# Patient Record
Sex: Female | Born: 1985
Health system: Southern US, Community
[De-identification: ages and names within clinical notes are randomized; demographics above are authoritative.]

## PROBLEM LIST (undated history)

## (undated) ENCOUNTER — Inpatient Hospital Stay (HOSPITAL_COMMUNITY): Payer: Self-pay

## (undated) DIAGNOSIS — I350 Nonrheumatic aortic (valve) stenosis: Secondary | ICD-10-CM

## (undated) DIAGNOSIS — F419 Anxiety disorder, unspecified: Secondary | ICD-10-CM

## (undated) DIAGNOSIS — F32A Depression, unspecified: Secondary | ICD-10-CM

## (undated) DIAGNOSIS — I719 Aortic aneurysm of unspecified site, without rupture: Secondary | ICD-10-CM

## (undated) DIAGNOSIS — D369 Benign neoplasm, unspecified site: Secondary | ICD-10-CM

## (undated) DIAGNOSIS — F329 Major depressive disorder, single episode, unspecified: Secondary | ICD-10-CM

## (undated) HISTORY — DX: Major depressive disorder, single episode, unspecified: F32.9

## (undated) HISTORY — PX: WISDOM TOOTH EXTRACTION: SHX21

## (undated) HISTORY — DX: Depression, unspecified: F32.A

## (undated) HISTORY — DX: Aortic aneurysm of unspecified site, without rupture: I71.9

---

## 2006-04-02 ENCOUNTER — Emergency Department (HOSPITAL_COMMUNITY): Admission: EM | Admit: 2006-04-02 | Discharge: 2006-04-02 | Payer: Self-pay | Admitting: Emergency Medicine

## 2010-03-01 ENCOUNTER — Other Ambulatory Visit: Admission: RE | Admit: 2010-03-01 | Discharge: 2010-03-01 | Payer: Self-pay | Admitting: Family Medicine

## 2010-06-30 ENCOUNTER — Emergency Department (HOSPITAL_BASED_OUTPATIENT_CLINIC_OR_DEPARTMENT_OTHER)
Admission: EM | Admit: 2010-06-30 | Discharge: 2010-07-01 | Payer: Self-pay | Source: Home / Self Care | Admitting: Emergency Medicine

## 2010-08-21 ENCOUNTER — Other Ambulatory Visit: Payer: Self-pay | Admitting: Cardiology

## 2010-08-28 ENCOUNTER — Other Ambulatory Visit (HOSPITAL_COMMUNITY): Payer: Self-pay

## 2010-09-04 ENCOUNTER — Other Ambulatory Visit: Payer: Self-pay | Admitting: Cardiology

## 2010-09-04 ENCOUNTER — Ambulatory Visit (HOSPITAL_COMMUNITY)
Admission: RE | Admit: 2010-09-04 | Discharge: 2010-09-04 | Disposition: A | Discharge status: Home / Self Care | Payer: 59 | Source: Ambulatory Visit | Attending: Cardiology | Admitting: Cardiology

## 2010-09-04 DIAGNOSIS — Q251 Coarctation of aorta: Secondary | ICD-10-CM

## 2010-09-04 DIAGNOSIS — Q249 Congenital malformation of heart, unspecified: Secondary | ICD-10-CM | POA: Insufficient documentation

## 2010-09-04 MED ORDER — GADOBENATE DIMEGLUMINE 529 MG/ML IV SOLN: 11.0000 mL | Freq: Once | INTRAVENOUS | Status: DC

## 2011-01-14 ENCOUNTER — Other Ambulatory Visit: Payer: Self-pay | Admitting: Family Medicine

## 2011-01-14 DIAGNOSIS — R1011 Right upper quadrant pain: Secondary | ICD-10-CM

## 2011-01-16 ENCOUNTER — Ambulatory Visit
Admission: RE | Admit: 2011-01-16 | Discharge: 2011-01-16 | Disposition: A | Discharge status: Home / Self Care | Payer: 59 | Source: Ambulatory Visit | Attending: Family Medicine | Admitting: Family Medicine

## 2011-01-16 DIAGNOSIS — R1011 Right upper quadrant pain: Secondary | ICD-10-CM

## 2011-08-14 ENCOUNTER — Other Ambulatory Visit (HOSPITAL_COMMUNITY): Payer: Self-pay | Admitting: Obstetrics and Gynecology

## 2011-08-14 DIAGNOSIS — N979 Female infertility, unspecified: Secondary | ICD-10-CM

## 2011-08-22 ENCOUNTER — Ambulatory Visit (HOSPITAL_COMMUNITY)
Admission: RE | Admit: 2011-08-22 | Discharge: 2011-08-22 | Disposition: A | Discharge status: Home / Self Care | Payer: 59 | Source: Ambulatory Visit | Attending: Obstetrics and Gynecology | Admitting: Obstetrics and Gynecology

## 2011-08-22 DIAGNOSIS — N979 Female infertility, unspecified: Secondary | ICD-10-CM | POA: Insufficient documentation

## 2011-08-22 MED ORDER — IOHEXOL 300 MG/ML  SOLN
20.0000 mL | Freq: Once | INTRAMUSCULAR | Status: AC | PRN
  Administered 2011-08-22: 10 mL

## 2011-08-30 ENCOUNTER — Encounter (HOSPITAL_BASED_OUTPATIENT_CLINIC_OR_DEPARTMENT_OTHER): Payer: Self-pay | Admitting: *Deleted

## 2011-08-30 ENCOUNTER — Emergency Department (HOSPITAL_BASED_OUTPATIENT_CLINIC_OR_DEPARTMENT_OTHER)
Admission: EM | Admit: 2011-08-30 | Discharge: 2011-08-31 | Disposition: A | Discharge status: Home / Self Care | Payer: 59 | Attending: Emergency Medicine | Admitting: Emergency Medicine

## 2011-08-30 ENCOUNTER — Emergency Department (HOSPITAL_BASED_OUTPATIENT_CLINIC_OR_DEPARTMENT_OTHER): Payer: 59

## 2011-08-30 DIAGNOSIS — J45909 Unspecified asthma, uncomplicated: Secondary | ICD-10-CM | POA: Insufficient documentation

## 2011-08-30 DIAGNOSIS — R059 Cough, unspecified: Secondary | ICD-10-CM | POA: Insufficient documentation

## 2011-08-30 DIAGNOSIS — R0602 Shortness of breath: Secondary | ICD-10-CM | POA: Insufficient documentation

## 2011-08-30 DIAGNOSIS — R062 Wheezing: Secondary | ICD-10-CM

## 2011-08-30 DIAGNOSIS — R05 Cough: Secondary | ICD-10-CM | POA: Insufficient documentation

## 2011-08-30 HISTORY — DX: Anxiety disorder, unspecified: F41.9

## 2011-08-30 HISTORY — DX: Nonrheumatic aortic (valve) stenosis: I35.0

## 2011-08-30 MED ORDER — IPRATROPIUM BROMIDE 0.02 % IN SOLN
0.5000 mg | Freq: Once | RESPIRATORY_TRACT | Status: AC
  Administered 2011-08-30: 0.5 mg via RESPIRATORY_TRACT

## 2011-08-30 MED ORDER — ALBUTEROL SULFATE (5 MG/ML) 0.5% IN NEBU
5.0000 mg | INHALATION_SOLUTION | Freq: Once | RESPIRATORY_TRACT | Status: AC
  Administered 2011-08-30: 5 mg via RESPIRATORY_TRACT

## 2011-08-30 MED ORDER — IPRATROPIUM BROMIDE 0.02 % IN SOLN
RESPIRATORY_TRACT | Status: AC
  Administered 2011-08-30: 0.5 mg via RESPIRATORY_TRACT
  Filled 2011-08-30: qty 2.5

## 2011-08-30 MED ORDER — ALBUTEROL SULFATE (5 MG/ML) 0.5% IN NEBU
5.0000 mg | INHALATION_SOLUTION | Freq: Once | RESPIRATORY_TRACT | Status: AC
  Administered 2011-08-30: 5 mg via RESPIRATORY_TRACT
  Filled 2011-08-30: qty 1

## 2011-08-30 MED ORDER — PREDNISONE 50 MG PO TABS
60.0000 mg | ORAL_TABLET | Freq: Once | ORAL | Status: AC
  Administered 2011-08-30: 60 mg via ORAL
  Filled 2011-08-30: qty 1

## 2011-08-30 MED ORDER — ALBUTEROL SULFATE (5 MG/ML) 0.5% IN NEBU
INHALATION_SOLUTION | RESPIRATORY_TRACT | Status: AC
Start: 2011-08-30 — End: 2011-08-30
  Administered 2011-08-30: 5 mg via RESPIRATORY_TRACT
  Filled 2011-08-30: qty 1

## 2011-08-30 MED ORDER — IPRATROPIUM BROMIDE 0.02 % IN SOLN
0.5000 mg | Freq: Once | RESPIRATORY_TRACT | Status: AC
  Administered 2011-08-30: 0.5 mg via RESPIRATORY_TRACT
  Filled 2011-08-30: qty 2.5

## 2011-08-30 NOTE — ED Notes (Signed)
Pt. Reports she does not want the  Chest x-ray due to poss. Pregnant.

## 2011-08-30 NOTE — ED Notes (Signed)
Pt. Reports she has been struggling with the asthma and bronchitis for 3 wks.  Pt. Was on steroid pk 21/2 wks ago.  Pt. Able to speak full sentences and no distress noted in Pt.

## 2011-08-30 NOTE — ED Provider Notes (Signed)
History     CSN: [id number]  Arrival date & time 08/30/11  2306   First MD Initiated Contact with Patient 08/30/11 2321      Chief Complaint  Patient presents with  . Shortness of Breath    Pt. has history of asthma    (Consider location/radiation/quality/duration/timing/severity/associated sxs/prior treatment) Patient is a 26 y.o. female presenting with shortness of breath. The history is provided by the patient.  Shortness of Breath  Associated symptoms include shortness of breath and wheezing. Pertinent negatives include no chest pain and no fever.  pt w hx asthma c/o sob/wheezing in past few days. States tx for bronchitis w abx, pred approx 3 weeks ago. Uses inhaler prn normally/at baseline. Notes transient improvement wheezing with albuterol.  occ non productive cough. No sinus drainage or congestion. Denies chest pain. No leg pain or swelling. No fever or chills.   Past Medical History  Diagnosis Date  . Aortic stenosis   . Asthma   . Anxiety     No past surgical history on file.  No family history on file.  History  Substance Use Topics  . Smoking status: Never Smoker   . Smokeless tobacco: Not on file  . Alcohol Use: No    OB History    Grav Para Term Preterm Abortions TAB SAB Ect Mult Living                  Review of Systems  Constitutional: Negative for fever and chills.  HENT: Negative for neck pain.   Eyes: Negative for redness.  Respiratory: Positive for shortness of breath and wheezing.   Cardiovascular: Negative for chest pain and leg swelling.  Gastrointestinal: Negative for abdominal pain.  Genitourinary: Negative for flank pain.  Musculoskeletal: Negative for back pain.  Skin: Negative for rash.  Neurological: Negative for headaches.  Hematological: Does not bruise/bleed easily.  Psychiatric/Behavioral: Negative for confusion.    Allergies  Septra  Home Medications   Current Outpatient Rx  Name Route Sig Dispense Refill  . ALBUTEROL  SULFATE (2.5 MG/3ML) 0.083% IN NEBU Nebulization Take 2.5 mg by nebulization every 6 (six) hours as needed.    Marland Kitchen CITALOPRAM HYDROBROMIDE 10 MG PO TABS Oral Take 10 mg by mouth daily.    Marland Kitchen FLUTICASONE-SALMETEROL 115-21 MCG/ACT IN AERO Inhalation Inhale 2 puffs into the lungs 2 (two) times daily.      BP 121/81  Pulse 83  Temp(Src) 97.8 F (36.6 C) (Oral)  Resp 20  Ht 5\' 5"  (1.651 m)  Wt 124 lb (56.246 kg)  BMI 20.63 kg/m2  SpO2 100%  LMP 08/14/2011  Physical Exam  Nursing note and vitals reviewed. Constitutional: She appears well-developed and well-nourished. No distress.  HENT:  Mouth/Throat: Oropharynx is clear and moist.  Eyes: Conjunctivae are normal. No scleral icterus.  Neck: Neck supple. No tracheal deviation present.  Cardiovascular: Normal rate, regular rhythm, normal heart sounds and intact distal pulses.  Exam reveals no gallop and no friction rub.   No murmur heard. Pulmonary/Chest: Effort normal. No respiratory distress. She has wheezes.  Abdominal: Normal appearance.  Musculoskeletal: She exhibits no edema and no tenderness.  Neurological: She is alert.  Skin: Skin is warm and dry. No rash noted.  Psychiatric: She has a normal mood and affect.    ED Course  Procedures (including critical care time)    MDM  Alb and atrovent neb. Mild wheeze.  pred po. Albuterol/atr neb. Cxr.  Pt refuses cxr.   Recheck improved air  exchange. No wheezing, breathing comfortably.  pcp is [facility] - discussed with pt pulm f/u and discussion home neb w pcp.       Suzi Roots, MD 08/31/11 [phone]

## 2011-08-31 MED ORDER — ALBUTEROL SULFATE HFA 108 (90 BASE) MCG/ACT IN AERS: 2.0000 | INHALATION_SPRAY | RESPIRATORY_TRACT | Status: DC | PRN

## 2011-08-31 MED ORDER — PREDNISONE 20 MG PO TABS: 60.0000 mg | ORAL_TABLET | Freq: Every day | ORAL | Status: AC

## 2011-08-31 NOTE — ED Notes (Signed)
Pt. Reports she feels better.  Pt. Is in no distress with Resp.

## 2011-08-31 NOTE — Discharge Instructions (Signed)
Take prednisone as prescribed. Use albuterol inhaler as need. Follow up with primary care doctor in the next few days. Discuss with them arrangements for home nebulizer and pulmonologist referral.  Return to ER if worse, difficulty breathing, high fevers, chest pain, other concern.    Asthma Attack Prevention HOW CAN ASTHMA BE PREVENTED? Currently, there is no way to prevent asthma from starting. However, you can take steps to control the disease and prevent its symptoms after you have been diagnosed. Learn about your asthma and how to control it. Take an active role to control your asthma by working with your caregiver to create and follow an asthma action plan. An asthma action plan guides you in taking your medicines properly, avoiding factors that make your asthma worse, tracking your level of asthma control, responding to worsening asthma, and seeking emergency care when needed. To track your asthma, keep records of your symptoms, check your peak flow number using a peak flow meter (handheld device that shows how well air moves out of your lungs), and get regular asthma checkups.  Other ways to prevent asthma attacks include:  Use medicines as your caregiver directs.   Identify and avoid things that make your asthma worse (as much as you can).   Keep track of your asthma symptoms and level of control.   Get regular checkups for your asthma.   With your caregiver, write a detailed plan for taking medicines and managing an asthma attack. Then be sure to follow your action plan. Asthma is an ongoing condition that needs regular monitoring and treatment.   Identify and avoid asthma triggers. A number of outdoor allergens and irritants (pollen, mold, cold air, air pollution) can trigger asthma attacks. Find out what causes or makes your asthma worse, and take steps to avoid those triggers (see below).   Monitor your breathing. Learn to recognize warning signs of an attack, such as slight  coughing, wheezing or shortness of breath. However, your lung function may already decrease before you notice any signs or symptoms, so regularly measure and record your peak airflow with a home peak flow meter.   Identify and treat attacks early. If you act quickly, you're less likely to have a severe attack. You will also need less medicine to control your symptoms. When your peak flow measurements decrease and alert you to an upcoming attack, take your medicine as instructed, and immediately stop any activity that may have triggered the attack. If your symptoms do not improve, get medical help.   Pay attention to increasing quick-relief inhaler use. If you find yourself relying on your quick-relief inhaler (such as albuterol), your asthma is not under control. See your caregiver about adjusting your treatment.  IDENTIFY AND CONTROL FACTORS THAT MAKE YOUR ASTHMA WORSE A number of common things can set off or make your asthma symptoms worse (asthma triggers). Keep track of your asthma symptoms for several weeks, detailing all the environmental and emotional factors that are linked with your asthma. When you have an asthma attack, go back to your asthma diary to see which factor, or combination of factors, might have contributed to it. Once you know what these factors are, you can take steps to control many of them.  Allergies: If you have allergies and asthma, it is important to take asthma prevention steps at home. Asthma attacks (worsening of asthma symptoms) can be triggered by allergies, which can cause temporary increased inflammation of your airways. Minimizing contact with the substance to which you are  allergic will help prevent an asthma attack. Animal Dander:   Some people are allergic to the flakes of skin or dried saliva from animals with fur or feathers. Keep these pets out of your home.   If you can't keep a pet outdoors, keep the pet out of your bedroom and other sleeping areas at all  times, and keep the door closed.   Remove carpets and furniture covered with cloth from your home. If that is not possible, keep the pet away from fabric-covered furniture and carpets.  Dust Mites:  Many people with asthma are allergic to dust mites. Dust mites are tiny bugs that are found in every home, in mattresses, pillows, carpets, fabric-covered furniture, bedcovers, clothes, stuffed toys, fabric, and other fabric-covered items.   Cover your mattress in a special dust-proof cover.   Cover your pillow in a special dust-proof cover, or wash the pillow each week in hot water. Water must be hotter than 130 F to kill dust mites. Cold or warm water used with detergent and bleach can also be effective.   Wash the sheets and blankets on your bed each week in hot water.   Try not to sleep or lie on cloth-covered cushions.   Call ahead when traveling and ask for a smoke-free hotel room. Bring your own bedding and pillows, in case the hotel only supplies feather pillows and down comforters, which may contain dust mites and cause asthma symptoms.   Remove carpets from your bedroom and those laid on concrete, if you can.   Keep stuffed toys out of the bed, or wash the toys weekly in hot water or cooler water with detergent and bleach.  Cockroaches:  Many people with asthma are allergic to the droppings and remains of cockroaches.   Keep food and garbage in closed containers. Never leave food out.   Use poison baits, traps, powders, gels, or paste (for example, boric acid).   If a spray is used to kill cockroaches, stay out of the room until the odor goes away.  Indoor Mold:  Fix leaky faucets, pipes, or other sources of water that have mold around them.   Clean moldy surfaces with a cleaner that has bleach in it.  Pollen and Outdoor Mold:  When pollen or mold spore counts are high, try to keep your windows closed.   Stay indoors with windows closed from late morning to afternoon, if  you can. Pollen and some mold spore counts are highest at that time.   Ask your caregiver whether you need to take or increase anti-inflammatory medicine before your allergy season starts.  Irritants:   Tobacco smoke is an irritant. If you smoke, ask your caregiver how you can quit. Ask family members to quit smoking, too. Do not allow smoking in your home or car.   If possible, do not use a wood-burning stove, kerosene heater, or fireplace. Minimize exposure to all sources of smoke, including incense, candles, fires, and fireworks.   Try to stay away from strong odors and sprays, such as perfume, talcum powder, hair spray, and paints.   Decrease humidity in your home and use an indoor air cleaning device. Reduce indoor humidity to below 60 percent. Dehumidifiers or central air conditioners can do this.   Try to have someone else vacuum for you once or twice a week, if you can. Stay out of rooms while they are being vacuumed and for a short while afterward.   If you vacuum, use a dust mask  from a hardware store, a double-layered or microfilter vacuum cleaner bag, or a vacuum cleaner with a HEPA filter.   Sulfites in foods and beverages can be irritants. Do not drink beer or wine, or eat dried fruit, processed potatoes, or shrimp if they cause asthma symptoms.   Cold air can trigger an asthma attack. Cover your nose and mouth with a scarf on cold or windy days.   Several health conditions can make asthma more difficult to manage, including runny nose, sinus infections, reflux disease, psychological stress, and sleep apnea. Your caregiver will treat these conditions, as well.   Avoid close contact with people who have a cold or the flu, since your asthma symptoms may get worse if you catch the infection from them. Wash your hands thoroughly after touching items that may have been handled by people with a respiratory infection.   Get a flu shot every year to protect against the flu virus, which  often makes asthma worse for days or weeks. Also get a pneumonia shot once every five to 10 years.  Drugs:  Aspirin and other painkillers can cause asthma attacks. 10% to 20% of people with asthma have sensitivity to aspirin or a group of painkillers called non-steroidal anti-inflammatory drugs (NSAIDS), such as ibuprofen and naproxen. These drugs are used to treat pain and reduce fevers. Asthma attacks caused by any of these medicines can be severe and even fatal. These drugs must be avoided in people who have known aspirin sensitive asthma. Products with acetaminophen are considered safe for people who have asthma. It is important that people with aspirin sensitivity read labels of all over-the-counter drugs used to treat pain, colds, coughs, and fever.   Beta blockers and ACE inhibitors are other drugs which you should discuss with your caregiver, in relation to your asthma.  ALLERGY SKIN TESTING  Ask your asthma caregiver about allergy skin testing or blood testing (RAST test) to identify the allergens to which you are sensitive. If you are found to have allergies, allergy shots (immunotherapy) for asthma may help prevent future allergies and asthma. With allergy shots, small doses of allergens (substances to which you are allergic) are injected under your skin on a regular schedule. Over a period of time, your body may become used to the allergen and less responsive with asthma symptoms. You can also take measures to minimize your exposure to those allergens. EXERCISE  If you have exercise-induced asthma, or are planning vigorous exercise, or exercise in cold, humid, or dry environments, prevent exercise-induced asthma by following your caregiver's advice regarding asthma treatment before exercising. Document Released: 06/05/2009 Document Revised: 02/27/2011 Document Reviewed: 06/05/2009 [facility] Patient Information 2012 [place], [place].

## 2011-08-31 NOTE — ED Notes (Signed)
Family at bedside. 

## 2011-11-26 ENCOUNTER — Encounter (HOSPITAL_COMMUNITY): Payer: Self-pay

## 2011-11-28 ENCOUNTER — Other Ambulatory Visit (HOSPITAL_COMMUNITY): Payer: 59

## 2011-11-29 ENCOUNTER — Encounter (HOSPITAL_COMMUNITY)
Admission: RE | Admit: 2011-11-29 | Discharge: 2011-11-29 | Disposition: A | Discharge status: Home / Self Care | Payer: 59 | Source: Ambulatory Visit | Attending: Obstetrics and Gynecology | Admitting: Obstetrics and Gynecology

## 2011-11-29 ENCOUNTER — Encounter (HOSPITAL_COMMUNITY): Payer: Self-pay

## 2011-11-29 LAB — CBC
Hemoglobin: 12 g/dL (ref 12.0–15.0)
MCH: 29.5 pg (ref 26.0–34.0)
MCHC: 32.7 g/dL (ref 30.0–36.0)

## 2011-11-29 LAB — SURGICAL PCR SCREEN: MRSA, PCR: NEGATIVE

## 2011-11-29 NOTE — Patient Instructions (Signed)
YOUR PROCEDURE IS SCHEDULED ON:12/09/11  ENTER THROUGH THE MAIN ENTRANCE OF [facility] AT:6am  USE DESK PHONE AND DIAL [id number] TO INFORM us OF YOUR ARRIVAL  CALL [phone] IF YOU HAVE ANY QUESTIONS OR PROBLEMS PRIOR TO YOUR ARRIVAL.  REMEMBER: DO NOT EAT OR DRINK AFTER MIDNIGHT :Sunday    YOU MAY BRUSH YOUR TEETH THE MORNING OF SURGERY   TAKE THESE MEDICINES THE DAY OF SURGERY WITH SIP OF WATER: am meds....bring inhaler   DO NOT WEAR JEWELRY, EYE MAKEUP, LIPSTICK OR DARK FINGERNAIL POLISH DO NOT WEAR LOTIONS  DO NOT SHAVE FOR 48 HOURS PRIOR TO SURGERY  YOU WILL NOT BE ALLOWED TO DRIVE YOURSELF HOME.  NAME OF DRIVER:Leslie Sutton

## 2011-12-02 NOTE — H&P (Signed)
ANGELL PINCOCK  DICTATION # [id number] CSN# [id number]   Meriel Pica, MD 12/02/2011 2:51 PM

## 2011-12-03 NOTE — H&P (Signed)
NAMEDEBORAHANN, Leslie Sutton              ACCOUNT NO.:  [id number]  MEDICAL RECORD NO.:  [id number]  LOCATION:                                 FACILITY:  PHYSICIAN:  Duke Salvia. Marcelle Overlie, M.D.DATE OF BIRTH:  1986-02-17  DATE OF ADMISSION:  12/09/2011 DATE OF DISCHARGE:                             HISTORY & PHYSICAL   CHIEF COMPLAINT:  Pelvic pain, probable dermoid cyst.  HPI:  This 26 year old, G0, P0.  I saw this patient initially in September 12 with concerns about relative infertility.  Initial screening showed a low mid-luteal progesterone, semen analysis and HSG were normal.  She has had 4-6 cycles of Clomid without success, but during her ovulation induction  ultrasounds, was noted to have September 26, 2011, a 6 cm complex cyst on the right ovary with some calcified areas probable dermoid, no free fluid noted.  This was seemed to persist on April 26, ultrasound again having a similar appearance of dermoid cyst. She is in the mean time developed some pelvic discomfort and prefers to have the dermoid removed before continuing on with infertility treatments.  Her office CA-125 was normal.  We discussed the small laparotomy for ovarian cystectomy/oophorectomy.  This procedure including risks related to bleeding, infection, adjacent organ injury, expected recovery time.  All reviewed with her which she understands and accepts.  ALLERGIES: 1. CECLOR. 2. SEPTRA.  CURRENT MEDICATIONS:  Prenatal vitamins.  REVIEW OF SYSTEMS:  Significant for a mild aortic valve congenital abnormality that has had no functional significance, her cardiologist has cleared her for surgery recently.  SOCIAL HISTORY:  Denies alcohol, tobacco, or drug use.  Dr. Teena Dunk at [place] [place] is her medical doctor.  FAMILY HISTORY:  Otherwise negative.  PHYSICAL EXAM:  VITAL SIGNS: Temp 98.2, blood pressure 130/82. HEENT: Unremarkable. NECK: Supple without masses. LUNGS: Clear. CARDIOVASCULAR: Regular rate and  rhythm without murmurs, rubs, gallops. Breasts without masses. ABDOMEN: Soft, flat, nontender.  Vulva, vagina, cervix normal.  Uterus mid position, normal size, slightly full on the right side.  Left side negative. EXTREMITIES/NEUROLOGIC: Unremarkable.  IMPRESSION:  Probable dermoid, right side.  PLAN:  Laparotomy with cystectomy/oophorectomy.  Procedure and risks discussed as above.     Leslie Sutton M. Marcelle Overlie, M.D.     [facility]/MEDQ  D:  12/02/2011  T:  12/02/2011  Job:  [id number]

## 2011-12-04 ENCOUNTER — Other Ambulatory Visit (HOSPITAL_COMMUNITY): Payer: 59

## 2011-12-09 ENCOUNTER — Ambulatory Visit (HOSPITAL_COMMUNITY)
Admission: RE | Admit: 2011-12-09 | Discharge: 2011-12-10 | Disposition: A | Discharge status: Home / Self Care | Payer: 59 | Source: Ambulatory Visit | Attending: Obstetrics and Gynecology | Admitting: Obstetrics and Gynecology

## 2011-12-09 ENCOUNTER — Encounter (HOSPITAL_COMMUNITY): Payer: Self-pay | Admitting: Anesthesiology

## 2011-12-09 ENCOUNTER — Encounter (HOSPITAL_COMMUNITY): Payer: Self-pay | Admitting: *Deleted

## 2011-12-09 ENCOUNTER — Inpatient Hospital Stay (HOSPITAL_COMMUNITY): Payer: 59 | Admitting: Anesthesiology

## 2011-12-09 ENCOUNTER — Encounter (HOSPITAL_COMMUNITY)
Admission: RE | Disposition: A | Discharge status: Home / Self Care | Payer: Self-pay | Source: Ambulatory Visit | Attending: Obstetrics and Gynecology

## 2011-12-09 DIAGNOSIS — N949 Unspecified condition associated with female genital organs and menstrual cycle: Secondary | ICD-10-CM | POA: Insufficient documentation

## 2011-12-09 DIAGNOSIS — D279 Benign neoplasm of unspecified ovary: Principal | ICD-10-CM | POA: Insufficient documentation

## 2011-12-09 HISTORY — PX: LAPAROTOMY: SHX154

## 2011-12-09 HISTORY — PX: SALPINGOOPHORECTOMY: SHX82

## 2011-12-09 LAB — PREGNANCY, URINE: Preg Test, Ur: NEGATIVE

## 2011-12-09 SURGERY — LAPAROTOMY, EXPLORATORY
Anesthesia: Spinal | Site: Abdomen | Laterality: Right | Wound class: Clean

## 2011-12-09 MED ORDER — KETOROLAC TROMETHAMINE 30 MG/ML IJ SOLN
INTRAMUSCULAR | Status: AC
  Filled 2011-12-09: qty 1

## 2011-12-09 MED ORDER — ZOLPIDEM TARTRATE 5 MG PO TABS: 5.0000 mg | ORAL_TABLET | Freq: Every evening | ORAL | Status: DC | PRN

## 2011-12-09 MED ORDER — KETOROLAC TROMETHAMINE 30 MG/ML IJ SOLN: 30.0000 mg | Freq: Four times a day (QID) | INTRAMUSCULAR | Status: DC

## 2011-12-09 MED ORDER — KETOROLAC TROMETHAMINE 30 MG/ML IJ SOLN
INTRAMUSCULAR | Status: AC
  Administered 2011-12-09: 30 mg via INTRAMUSCULAR
  Filled 2011-12-09: qty 1

## 2011-12-09 MED ORDER — NALOXONE HCL 0.4 MG/ML IJ SOLN: 0.4000 mg | INTRAMUSCULAR | Status: DC | PRN

## 2011-12-09 MED ORDER — CITALOPRAM HYDROBROMIDE 20 MG PO TABS
20.0000 mg | ORAL_TABLET | Freq: Every day | ORAL | Status: DC
  Administered 2011-12-10: 20 mg via ORAL
  Filled 2011-12-09 (×2): qty 1

## 2011-12-09 MED ORDER — DIPHENHYDRAMINE HCL 50 MG/ML IJ SOLN: 12.5000 mg | INTRAMUSCULAR | Status: DC | PRN

## 2011-12-09 MED ORDER — IBUPROFEN 800 MG PO TABS
800.0000 mg | ORAL_TABLET | Freq: Three times a day (TID) | ORAL | Status: DC | PRN
  Administered 2011-12-10: 800 mg via ORAL
  Filled 2011-12-09 (×2): qty 1

## 2011-12-09 MED ORDER — ROCURONIUM BROMIDE 50 MG/5ML IV SOLN
INTRAVENOUS | Status: AC
  Filled 2011-12-09: qty 1

## 2011-12-09 MED ORDER — FENTANYL CITRATE 0.05 MG/ML IJ SOLN
INTRAMUSCULAR | Status: AC
  Filled 2011-12-09: qty 2

## 2011-12-09 MED ORDER — IBUPROFEN 600 MG PO TABS: 600.0000 mg | ORAL_TABLET | Freq: Four times a day (QID) | ORAL | Status: DC | PRN

## 2011-12-09 MED ORDER — DIPHENHYDRAMINE HCL 25 MG PO CAPS: 25.0000 mg | ORAL_CAPSULE | ORAL | Status: DC | PRN

## 2011-12-09 MED ORDER — NALBUPHINE SYRINGE 5 MG/0.5 ML
INJECTION | INTRAMUSCULAR | Status: AC
  Filled 2011-12-09: qty 0.5

## 2011-12-09 MED ORDER — OXYCODONE-ACETAMINOPHEN 5-325 MG PO TABS
1.0000 | ORAL_TABLET | ORAL | Status: DC | PRN
  Administered 2011-12-09: 1 via ORAL
  Administered 2011-12-10: 2 via ORAL
  Administered 2011-12-10 (×3): 1 via ORAL
  Filled 2011-12-09: qty 2
  Filled 2011-12-09: qty 1
  Filled 2011-12-09 (×2): qty 2
  Filled 2011-12-09: qty 1

## 2011-12-09 MED ORDER — MEPERIDINE HCL 25 MG/ML IJ SOLN: 6.2500 mg | INTRAMUSCULAR | Status: DC | PRN

## 2011-12-09 MED ORDER — MIDAZOLAM HCL 5 MG/5ML IJ SOLN
INTRAMUSCULAR | Status: DC | PRN
  Administered 2011-12-09: 2 mg via INTRAVENOUS

## 2011-12-09 MED ORDER — MIDAZOLAM HCL 2 MG/2ML IJ SOLN
INTRAMUSCULAR | Status: AC
  Filled 2011-12-09: qty 2

## 2011-12-09 MED ORDER — NALBUPHINE HCL 10 MG/ML IJ SOLN
5.0000 mg | INTRAMUSCULAR | Status: DC | PRN
  Administered 2011-12-09: 5 mg via SUBCUTANEOUS
  Filled 2011-12-09: qty 1

## 2011-12-09 MED ORDER — MORPHINE SULFATE 0.5 MG/ML IJ SOLN
INTRAMUSCULAR | Status: AC
  Filled 2011-12-09: qty 10

## 2011-12-09 MED ORDER — FENTANYL CITRATE 0.05 MG/ML IJ SOLN: 25.0000 ug | INTRAMUSCULAR | Status: DC | PRN

## 2011-12-09 MED ORDER — LACTATED RINGERS IV SOLN
INTRAVENOUS | Status: DC
  Administered 2011-12-09 (×2): via INTRAVENOUS
  Administered 2011-12-09: 50 mL/h via INTRAVENOUS

## 2011-12-09 MED ORDER — SODIUM CHLORIDE 0.9 % IJ SOLN: 3.0000 mL | INTRAMUSCULAR | Status: DC | PRN

## 2011-12-09 MED ORDER — SCOPOLAMINE 1 MG/3DAYS TD PT72
1.0000 | MEDICATED_PATCH | Freq: Once | TRANSDERMAL | Status: DC
  Administered 2011-12-09: 1.5 mg via TRANSDERMAL

## 2011-12-09 MED ORDER — KETOROLAC TROMETHAMINE 30 MG/ML IJ SOLN
30.0000 mg | Freq: Four times a day (QID) | INTRAMUSCULAR | Status: DC
  Filled 2011-12-09: qty 1

## 2011-12-09 MED ORDER — METOCLOPRAMIDE HCL 5 MG/ML IJ SOLN: 10.0000 mg | Freq: Three times a day (TID) | INTRAMUSCULAR | Status: DC | PRN

## 2011-12-09 MED ORDER — LIDOCAINE HCL (CARDIAC) 20 MG/ML IV SOLN
INTRAVENOUS | Status: AC
  Filled 2011-12-09: qty 5

## 2011-12-09 MED ORDER — MORPHINE SULFATE (PF) 0.5 MG/ML IJ SOLN
INTRAMUSCULAR | Status: DC | PRN
  Administered 2011-12-09: .1 mg via INTRATHECAL

## 2011-12-09 MED ORDER — ONDANSETRON HCL 4 MG PO TABS: 4.0000 mg | ORAL_TABLET | Freq: Four times a day (QID) | ORAL | Status: DC | PRN

## 2011-12-09 MED ORDER — KETOROLAC TROMETHAMINE 30 MG/ML IJ SOLN
30.0000 mg | Freq: Four times a day (QID) | INTRAMUSCULAR | Status: AC | PRN
  Administered 2011-12-09: 30 mg via INTRAMUSCULAR

## 2011-12-09 MED ORDER — DEXTROSE IN LACTATED RINGERS 5 % IV SOLN: INTRAVENOUS | Status: DC

## 2011-12-09 MED ORDER — ONDANSETRON HCL 4 MG/2ML IJ SOLN: 4.0000 mg | Freq: Four times a day (QID) | INTRAMUSCULAR | Status: DC | PRN

## 2011-12-09 MED ORDER — KETOROLAC TROMETHAMINE 60 MG/2ML IM SOLN
60.0000 mg | Freq: Once | INTRAMUSCULAR | Status: AC | PRN
  Filled 2011-12-09: qty 2

## 2011-12-09 MED ORDER — FLUTICASONE-SALMETEROL 115-21 MCG/ACT IN AERO
1.0000 | INHALATION_SPRAY | Freq: Every day | RESPIRATORY_TRACT | Status: DC
  Filled 2011-12-09: qty 8

## 2011-12-09 MED ORDER — KETOROLAC TROMETHAMINE 30 MG/ML IJ SOLN: 15.0000 mg | Freq: Once | INTRAMUSCULAR | Status: DC | PRN

## 2011-12-09 MED ORDER — KETOROLAC TROMETHAMINE 30 MG/ML IJ SOLN: 30.0000 mg | Freq: Once | INTRAMUSCULAR | Status: DC

## 2011-12-09 MED ORDER — MENTHOL 3 MG MT LOZG: 1.0000 | LOZENGE | OROMUCOSAL | Status: DC | PRN

## 2011-12-09 MED ORDER — ONDANSETRON HCL 4 MG/2ML IJ SOLN
INTRAMUSCULAR | Status: AC
  Filled 2011-12-09: qty 2

## 2011-12-09 MED ORDER — FENTANYL CITRATE 0.05 MG/ML IJ SOLN
INTRAMUSCULAR | Status: AC
  Filled 2011-12-09: qty 5

## 2011-12-09 MED ORDER — BUPIVACAINE HCL (PF) 0.5 % IJ SOLN
INTRAMUSCULAR | Status: AC
  Filled 2011-12-09: qty 270

## 2011-12-09 MED ORDER — SCOPOLAMINE 1 MG/3DAYS TD PT72
MEDICATED_PATCH | TRANSDERMAL | Status: AC
  Administered 2011-12-09: 1.5 mg via TRANSDERMAL
  Filled 2011-12-09: qty 1

## 2011-12-09 MED ORDER — PROPOFOL 10 MG/ML IV EMUL
INTRAVENOUS | Status: AC
  Filled 2011-12-09: qty 20

## 2011-12-09 MED ORDER — ALBUTEROL SULFATE (5 MG/ML) 0.5% IN NEBU: 2.5000 mg | INHALATION_SOLUTION | Freq: Four times a day (QID) | RESPIRATORY_TRACT | Status: DC | PRN

## 2011-12-09 MED ORDER — DIPHENHYDRAMINE HCL 50 MG/ML IJ SOLN: 25.0000 mg | INTRAMUSCULAR | Status: DC | PRN

## 2011-12-09 MED ORDER — PROPOFOL 10 MG/ML IV EMUL
INTRAVENOUS | Status: DC | PRN
  Administered 2011-12-09: 75 ug/kg/min via INTRAVENOUS

## 2011-12-09 MED ORDER — KETOROLAC TROMETHAMINE 30 MG/ML IJ SOLN
30.0000 mg | Freq: Four times a day (QID) | INTRAMUSCULAR | Status: AC | PRN
  Administered 2011-12-09: 30 mg via INTRAVENOUS
  Filled 2011-12-09: qty 1

## 2011-12-09 MED ORDER — NALBUPHINE HCL 10 MG/ML IJ SOLN
5.0000 mg | INTRAMUSCULAR | Status: DC | PRN
  Filled 2011-12-09: qty 1

## 2011-12-09 MED ORDER — LORATADINE 10 MG PO TABS
10.0000 mg | ORAL_TABLET | Freq: Every day | ORAL | Status: DC
  Administered 2011-12-10: 10 mg via ORAL
  Filled 2011-12-09 (×2): qty 1

## 2011-12-09 MED ORDER — FENTANYL CITRATE 0.05 MG/ML IJ SOLN
INTRAMUSCULAR | Status: DC | PRN
  Administered 2011-12-09: 50 ug via INTRAVENOUS
  Administered 2011-12-09: 15 ug via INTRATHECAL
  Administered 2011-12-09: 100 ug via INTRAVENOUS

## 2011-12-09 MED ORDER — BUTORPHANOL TARTRATE 2 MG/ML IJ SOLN
1.0000 mg | INTRAMUSCULAR | Status: DC | PRN
  Administered 2011-12-09 (×2): 2 mg via INTRAVENOUS
  Filled 2011-12-09 (×2): qty 1

## 2011-12-09 MED ORDER — ONDANSETRON HCL 4 MG/2ML IJ SOLN: 4.0000 mg | Freq: Three times a day (TID) | INTRAMUSCULAR | Status: DC | PRN

## 2011-12-09 MED ORDER — SODIUM CHLORIDE 0.9 % IV SOLN: 1.0000 ug/kg/h | INTRAVENOUS | Status: DC | PRN

## 2011-12-09 MED ORDER — BUPIVACAINE IN DEXTROSE 0.75-8.25 % IT SOLN
INTRATHECAL | Status: DC | PRN
  Administered 2011-12-09: 1.4 mL via INTRATHECAL

## 2011-12-09 MED ORDER — ONDANSETRON HCL 4 MG/2ML IJ SOLN
INTRAMUSCULAR | Status: DC | PRN
  Administered 2011-12-09: 4 mg via INTRAVENOUS

## 2011-12-09 MED ORDER — ALBUTEROL SULFATE HFA 108 (90 BASE) MCG/ACT IN AERS: 2.0000 | INHALATION_SPRAY | RESPIRATORY_TRACT | Status: DC | PRN

## 2011-12-09 SURGICAL SUPPLY — 29 items
CANISTER SUCTION 2500CC (MISCELLANEOUS) ×3 IMPLANT
CATH KIT ON Q 5IN DUAL SLV (PAIN MANAGEMENT) IMPLANT
CLOTH BEACON ORANGE TIMEOUT ST (SAFETY) ×3 IMPLANT
CONTAINER PREFILL 10% NBF 60ML (FORM) IMPLANT
DECANTER SPIKE VIAL GLASS SM (MISCELLANEOUS) ×3 IMPLANT
DRESSING TELFA 8X3 (GAUZE/BANDAGES/DRESSINGS) ×3 IMPLANT
DRSG COVADERM 4X10 (GAUZE/BANDAGES/DRESSINGS) ×3 IMPLANT
DRSG TEGADERM 2.38X2.75 (GAUZE/BANDAGES/DRESSINGS) IMPLANT
GAUZE SPONGE 4X4 12PLY STRL LF (GAUZE/BANDAGES/DRESSINGS) ×6 IMPLANT
GLOVE BIO SURGEON STRL SZ7 (GLOVE) ×6 IMPLANT
GOWN PREVENTION PLUS LG XLONG (DISPOSABLE) ×9 IMPLANT
NEEDLE HYPO 25X1 1.5 SAFETY (NEEDLE) ×3 IMPLANT
NS IRRIG 1000ML POUR BTL (IV SOLUTION) ×3 IMPLANT
PACK ABDOMINAL GYN (CUSTOM PROCEDURE TRAY) ×3 IMPLANT
PAD OB MATERNITY 4.3X12.25 (PERSONAL CARE ITEMS) ×3 IMPLANT
SPONGE LAP 18X18 X RAY DECT (DISPOSABLE) ×3 IMPLANT
STRIP CLOSURE SKIN 1/4X4 (GAUZE/BANDAGES/DRESSINGS) ×3 IMPLANT
SUT CHROMIC 0 CT 1 (SUTURE) ×3 IMPLANT
SUT MON AB 4-0 PS1 27 (SUTURE) ×3 IMPLANT
SUT PDS AB 0 CT1 27 (SUTURE) ×6 IMPLANT
SUT VIC AB 0 CT1 18XCR BRD8 (SUTURE) ×2 IMPLANT
SUT VIC AB 0 CT1 8-18 (SUTURE) ×1
SUT VIC AB 3-0 CT1 27 (SUTURE) ×1
SUT VIC AB 3-0 CT1 TAPERPNT 27 (SUTURE) ×2 IMPLANT
SUT VICRYL 0 TIES 12 18 (SUTURE) ×3 IMPLANT
SYR CONTROL 10ML LL (SYRINGE) ×3 IMPLANT
TOWEL OR 17X24 6PK STRL BLUE (TOWEL DISPOSABLE) ×6 IMPLANT
TRAY FOLEY CATH 14FR (SET/KITS/TRAYS/PACK) ×3 IMPLANT
WATER STERILE IRR 1000ML POUR (IV SOLUTION) IMPLANT

## 2011-12-09 NOTE — Anesthesia Procedure Notes (Signed)
Spinal  Patient location during procedure: OR Start time: 12/09/2011 7:31 AM Reason for block: procedure for pain Staffing Performed by: anesthesiologist  Preanesthetic Checklist Completed: patient identified, site marked, surgical consent, pre-op evaluation, timeout performed, IV checked, risks and benefits discussed and monitors and equipment checked Spinal Block Patient position: sitting Prep: site prepped and draped and DuraPrep Patient monitoring: heart rate, cardiac monitor, continuous pulse ox and blood pressure Approach: midline Location: L3-4 Injection technique: single-shot Needle Needle type: Sprotte  Needle gauge: 24 G Needle length: 9 cm Assessment Sensory level: T4 Additional Notes Clear free flow CSF on first attempt.  No parethesia.  Patient tolerated procedure well.  Jasmine December, MD

## 2011-12-09 NOTE — Anesthesia Preprocedure Evaluation (Signed)
Anesthesia Evaluation  Patient identified by MRN, date of birth, ID band Patient awake    Reviewed: Allergy & Precautions, H&P , NPO status , Patient's Chart, lab work & pertinent test results, reviewed documented beta blocker date and time   History of Anesthesia Complications Negative for: history of anesthetic complications  Airway Mallampati: II TM Distance: >3 FB Neck ROM: full    Dental  (+) Teeth Intact   Pulmonary asthma (advair, and almost daily albuterol use, most recent flare 2/13 - took steroids, no hospitalizations or intubations) ,  breath sounds clear to auscultation  Pulmonary exam normal       Cardiovascular Exercise Tolerance: Good + Valvular Problems/Murmurs (congenital bicuspid aortic valve with mild AS and mild-mod AI, cardiac clearance on chart) AS and AI Rhythm:regular Rate:Normal     Neuro/Psych PSYCHIATRIC DISORDERS (anxiety) negative neurological ROS     GI/Hepatic negative GI ROS, Neg liver ROS,   Endo/Other  negative endocrine ROS  Renal/GU negative Renal ROS  Female GU complaint     Musculoskeletal   Abdominal   Peds  Hematology negative hematology ROS (+)   Anesthesia Other Findings   Reproductive/Obstetrics negative OB ROS                           Anesthesia Physical Anesthesia Plan  ASA: II  Anesthesia Plan: Spinal   Post-op Pain Management:    Induction:   Airway Management Planned:   Additional Equipment:   Intra-op Plan:   Post-operative Plan:   Informed Consent: I have reviewed the patients History and Physical, chart, labs and discussed the procedure including the risks, benefits and alternatives for the proposed anesthesia with the patient or authorized representative who has indicated his/her understanding and acceptance.   Dental Advisory Given  Plan Discussed with: CRNA and Surgeon  Anesthesia Plan Comments:          Anesthesia Quick Evaluation

## 2011-12-09 NOTE — Transfer of Care (Signed)
Immediate Anesthesia Transfer of Care Note  Patient: Leslie Sutton  Procedure(s) Performed: Procedure(s) (LRB): EXPLORATORY LAPAROTOMY (N/A) SALPINGO OOPHERECTOMY (Right)  Patient Location: PACU  Anesthesia Type: Spinal  Level of Consciousness: awake, alert  and oriented  Airway & Oxygen Therapy: Patient Spontanous Breathing  Post-op Assessment: Report given to PACU RN and Post -op Vital signs reviewed and stable  Post vital signs: stable  Complications: No apparent anesthesia complications

## 2011-12-09 NOTE — Progress Notes (Signed)
The patient was re-examined with no change in status 

## 2011-12-09 NOTE — Addendum Note (Signed)
Addendum  created 12/09/11 1734 by Suella Grove, CRNA   Modules edited:Notes Section

## 2011-12-09 NOTE — Anesthesia Postprocedure Evaluation (Signed)
  Anesthesia Post-op Note  Patient: Leslie Sutton  Procedure(s) Performed: Procedure(s) (LRB): EXPLORATORY LAPAROTOMY (N/A) SALPINGO OOPHERECTOMY (Right)  Patient Location: [facility] Unit  Anesthesia Type: General  Level of Consciousness: awake  Airway and Oxygen Therapy: Patient Spontanous Breathing  Post-op Pain: none  Post-op Assessment: Post-op Vital signs reviewed  Post-op Vital Signs: Reviewed and stable  Complications: No apparent anesthesia complications

## 2011-12-09 NOTE — Anesthesia Postprocedure Evaluation (Signed)
Anesthesia Post Note  Patient: Leslie Sutton  Procedure(s) Performed: Procedure(s) (LRB): EXPLORATORY LAPAROTOMY (N/A) SALPINGO OOPHERECTOMY (Right)  Anesthesia type: Spinal  Patient location: PACU  Post pain: Pain level controlled  Post assessment: Post-op Vital signs reviewed  Last Vitals:  Filed Vitals:   12/09/11 0938  BP: 101/71  Pulse: 67  Temp: 36.6 C  Resp: 16    Post vital signs: Reviewed  Level of consciousness: awake  Complications: No apparent anesthesia complications

## 2011-12-10 ENCOUNTER — Encounter (HOSPITAL_COMMUNITY): Payer: Self-pay | Admitting: Obstetrics and Gynecology

## 2011-12-10 LAB — CBC
HCT: 31.6 % — ABNORMAL LOW (ref 36.0–46.0)
RDW: 12.8 % (ref 11.5–15.5)
WBC: 5.8 10*3/uL (ref 4.0–10.5)

## 2011-12-10 MED ORDER — LACTATED RINGERS IV SOLN
INTRAVENOUS | Status: DC
  Administered 2011-12-10: 01:00:00 via INTRAVENOUS

## 2011-12-10 MED ORDER — OXYCODONE-ACETAMINOPHEN 5-325 MG PO TABS: 1.0000 | ORAL_TABLET | Freq: Four times a day (QID) | ORAL | Status: AC | PRN

## 2011-12-10 MED ORDER — IBUPROFEN 800 MG PO TABS: 800.0000 mg | ORAL_TABLET | Freq: Three times a day (TID) | ORAL | Status: AC | PRN

## 2011-12-10 NOTE — Discharge Summary (Signed)
Physician Discharge Summary  Patient ID: Leslie Sutton MRN: [id number] DOB/AGE: [birth year] 26 y.o.  Admit date: 12/09/2011 Discharge date: 12/10/2011  Admission Diagnoses:  Discharge Diagnoses:  Active Problems:  * No active hospital problems. *    Discharged Condition: good  Hospital Course: EL for RSO for Dermoid cyst, DC POD # 1, afeb, Inc C/D + tol PO, ambulating w/o problems  Consults: None  Significant Diagnostic Studies: labs:  Results for orders placed during the hospital encounter of 12/09/11 (from the past 24 hour(s))  CBC     Status: Abnormal   Collection Time   12/10/11  5:25 AM      Component Value Range   WBC 5.8  4.0 - 10.5 (K/uL)   RBC 3.44 (*) 3.87 - 5.11 (MIL/uL)   Hemoglobin 10.3 (*) 12.0 - 15.0 (g/dL)   HCT 40.9 (*) 81.1 - 46.0 (%)   MCV 91.9  78.0 - 100.0 (fL)   MCH 29.9  26.0 - 34.0 (pg)   MCHC 32.6  30.0 - 36.0 (g/dL)   RDW 91.4  78.2 - 95.6 (%)   Platelets 168  150 - 400 (K/uL)    Treatments: surgery: RSO  Discharge Exam: Blood pressure 83/59, pulse 61, temperature 97.7 F (36.5 C), temperature source Oral, resp. rate 19, height 5\' 5"  (1.651 m), weight 123 lb (55.792 kg), last menstrual period 11/12/2011, SpO2 97.00%. Incision/Wound:C/D  Disposition: 01-Home or Self Care   Medication List  As of 12/10/2011  7:44 AM   STOP taking these medications         acetaminophen 500 MG tablet         TAKE these medications         albuterol 108 (90 BASE) MCG/ACT inhaler   Commonly known as: PROVENTIL HFA;VENTOLIN HFA   Inhale 2 puffs into the lungs every 4 (four) hours as needed for wheezing.      albuterol (2.5 MG/3ML) 0.083% nebulizer solution   Commonly known as: PROVENTIL   Take 2.5 mg by nebulization every 6 (six) hours as needed. Uses about twice/year for severe cases      citalopram 20 MG tablet   Commonly known as: CELEXA   Take 20 mg by mouth daily.      fluticasone-salmeterol 115-21 MCG/ACT inhaler   Commonly known as: ADVAIR  HFA   Inhale 1 puff into the lungs daily.      ibuprofen 800 MG tablet   Commonly known as: ADVIL,MOTRIN   Take 1 tablet (800 mg total) by mouth every 8 (eight) hours as needed (mild pain).      loratadine 10 MG tablet   Commonly known as: CLARITIN   Take 10 mg by mouth daily.      oxyCODONE-acetaminophen 5-325 MG per tablet   Commonly known as: PERCOCET   Take 1-2 tablets by mouth every 6 (six) hours as needed (moderate to severe pain (when tolerating fluids)).           Follow-up Information    Follow up with Meriel Pica, MD in 7 days. (office will call)    Contact information:   [address] [place] [address] [phone]          Signed: Meriel Pica 12/10/2011, 7:44 AM

## 2011-12-10 NOTE — Op Note (Signed)
Preoperative diagnosis: Probable right dermoid cyst, pelvic pain  Postoperative diagnosis: Same  Procedure: Laparotomy with RSO  Surgeon: Marcelle Overlie  Anesthesia: Spinal  EBL: 75 cc  Complications: None  Drains: Foley catheter  Specimens removed: Right tube and ovary, to pathology  Procedure and findings:  The patient taken the operating room after an adequate level of spinal anesthetic was obtained the patient's lower abdomen was prepped and draped in the usual fashion for laparotomy. Foley catheter was positioned draining clear urine. Transverse Pfannenstiel incision was made 2 finger restaurant symphysis carried down to the fascia which was incised and extended transversely. Rectus muscle divided in the midline peritoneum entered superiorly without incident and extended in a vertical fashion. O'Connor-O'Sullivan retractor was positioned bowel Speck superiorly out of the field the pelvic findings as follows:  The uterus itself was normal size left tube and ovary were completely normal the right ovary was enlarged to 7 cm smooth-walled cyst cul-de-sac was free and clear no other abnormalities were noted. The relatively small incision, was able to elevate the right ovary out of the incision, the right ovary was completely replaced by the cyst, cannot find any functional right ovarian tissue for possible right ovarian cystectomy. The right tube was pulled up into the right ovary making it impossible to leave a reasonable, functional right tube. Decision made to proceed with RSO, Kelly clamps were then placed across the right IP ligament close to the ovary in sequential fashion the ovary and tube were resected, pedicles secure with 2-0 Vicryl transfixed sutures. This was hemostatic on the side table the ovary was opened up and had typical findings for dermoid here in or material. This was sent to pathology. The pelvis is irrigated and aspirated noted be hemostatic again the left side appeared  completely normal no evidence of endometriosis adhesions etc. Prior to closure sponge denies precast reported as correct x2 peritoneum closed the running 2-0 Vicryl suture. 2-0 Vicryl interrupted sutures used to close the rectus muscles in the midline followed by 0 PDS from laterally to midline on either side to close the fascia. Subcutaneous tissue was hemostatic 4-0 Monocryl subcuticular suture with a Telfa dressing applied she tolerated this well went to recovery room in good condition.  Dictated with [facility]  Leslie Sutton M. Milana Obey.D.

## 2011-12-10 NOTE — Progress Notes (Signed)
1 Day Post-Op Procedure(s) (LRB): EXPLORATORY LAPAROTOMY (N/A) SALPINGO OOPHERECTOMY (Right)  Subjective: Patient reports tolerating PO.    Objective: I have reviewed patient's vital signs, medications and labs.  Inc C/D, abd soft + BS CBC    Component Value Date/Time   WBC 5.8 12/10/2011 0525   RBC 3.44* 12/10/2011 0525   HGB 10.3* 12/10/2011 0525   HCT 31.6* 12/10/2011 0525   PLT 168 12/10/2011 0525   MCV 91.9 12/10/2011 0525   MCH 29.9 12/10/2011 0525   MCHC 32.6 12/10/2011 0525   RDW 12.8 12/10/2011 0525      Assessment: s/p Procedure(s) (LRB): EXPLORATORY LAPAROTOMY (N/A) SALPINGO OOPHERECTOMY (Right): stable  Plan: Discharge home  LOS: 1 day    Kamaljit Hizer M 12/10/2011, 7:40 AM

## 2012-02-11 ENCOUNTER — Other Ambulatory Visit (HOSPITAL_COMMUNITY): Payer: Self-pay | Admitting: Gynecology

## 2012-02-11 DIAGNOSIS — Z3141 Encounter for fertility testing: Secondary | ICD-10-CM

## 2012-02-18 ENCOUNTER — Ambulatory Visit (HOSPITAL_COMMUNITY): Payer: 59

## 2012-02-19 ENCOUNTER — Other Ambulatory Visit (HOSPITAL_COMMUNITY): Payer: Self-pay | Admitting: Gynecology

## 2012-02-19 DIAGNOSIS — Z3141 Encounter for fertility testing: Secondary | ICD-10-CM

## 2012-02-20 ENCOUNTER — Ambulatory Visit (HOSPITAL_COMMUNITY)
Admission: RE | Admit: 2012-02-20 | Discharge: 2012-02-20 | Disposition: A | Discharge status: Home / Self Care | Payer: 59 | Source: Ambulatory Visit | Attending: Gynecology | Admitting: Gynecology

## 2012-02-20 DIAGNOSIS — N979 Female infertility, unspecified: Secondary | ICD-10-CM | POA: Insufficient documentation

## 2012-02-20 DIAGNOSIS — Z3141 Encounter for fertility testing: Secondary | ICD-10-CM

## 2012-02-20 MED ORDER — IOHEXOL 300 MG/ML  SOLN: 20.0000 mL | Freq: Once | INTRAMUSCULAR | Status: AC | PRN

## 2012-08-28 ENCOUNTER — Encounter (HOSPITAL_BASED_OUTPATIENT_CLINIC_OR_DEPARTMENT_OTHER): Payer: Self-pay | Admitting: Emergency Medicine

## 2012-08-28 ENCOUNTER — Emergency Department (HOSPITAL_BASED_OUTPATIENT_CLINIC_OR_DEPARTMENT_OTHER)
Admission: EM | Admit: 2012-08-28 | Discharge: 2012-08-29 | Disposition: A | Discharge status: Home / Self Care | Payer: Managed Care, Other (non HMO) | Attending: Emergency Medicine | Admitting: Emergency Medicine

## 2012-08-28 ENCOUNTER — Emergency Department (HOSPITAL_BASED_OUTPATIENT_CLINIC_OR_DEPARTMENT_OTHER): Payer: Managed Care, Other (non HMO)

## 2012-08-28 DIAGNOSIS — Z3202 Encounter for pregnancy test, result negative: Secondary | ICD-10-CM | POA: Insufficient documentation

## 2012-08-28 DIAGNOSIS — F411 Generalized anxiety disorder: Secondary | ICD-10-CM | POA: Insufficient documentation

## 2012-08-28 DIAGNOSIS — J45909 Unspecified asthma, uncomplicated: Secondary | ICD-10-CM | POA: Insufficient documentation

## 2012-08-28 DIAGNOSIS — IMO0002 Reserved for concepts with insufficient information to code with codable children: Secondary | ICD-10-CM | POA: Insufficient documentation

## 2012-08-28 DIAGNOSIS — Z8679 Personal history of other diseases of the circulatory system: Secondary | ICD-10-CM | POA: Insufficient documentation

## 2012-08-28 DIAGNOSIS — Z79899 Other long term (current) drug therapy: Secondary | ICD-10-CM | POA: Insufficient documentation

## 2012-08-28 DIAGNOSIS — R188 Other ascites: Secondary | ICD-10-CM | POA: Insufficient documentation

## 2012-08-28 LAB — CBC WITH DIFFERENTIAL/PLATELET
Lymphocytes Relative: 11 % — ABNORMAL LOW (ref 12–46)
Lymphs Abs: 1.3 10*3/uL (ref 0.7–4.0)
MCV: 89.6 fL (ref 78.0–100.0)
Neutrophils Relative %: 78 % — ABNORMAL HIGH (ref 43–77)
Platelets: 246 10*3/uL (ref 150–400)
RBC: 3.47 MIL/uL — ABNORMAL LOW (ref 3.87–5.11)
WBC: 11.1 10*3/uL — ABNORMAL HIGH (ref 4.0–10.5)

## 2012-08-28 LAB — BASIC METABOLIC PANEL
CO2: 25 mEq/L (ref 19–32)
Chloride: 101 mEq/L (ref 96–112)
Glucose, Bld: 109 mg/dL — ABNORMAL HIGH (ref 70–99)
Potassium: 3.8 mEq/L (ref 3.5–5.1)
Sodium: 136 mEq/L (ref 135–145)

## 2012-08-28 NOTE — ED Notes (Signed)
Pt's husband checked her o2 at home and it was 89%, he reports placing her on 100% nrb and oxygen came up to high 90's, pt has history of anxiety, first ivf retrieval today, they have notified infertility MD

## 2012-08-28 NOTE — ED Notes (Signed)
Pt. States she had egg retrieval for IVF this am.  Around 1700, she had sudden onset right shoulder pain and SHOB.  Around 2000, husband reports that she stood up and appeared "gray".  She states she felt like her "ribs were cracking".  Her husband applied a pulse ox at home with a reading of 89%.  He then applied a NRB at 15L for approximately 20 minutes and her sats returned to 100%.  Pt. also feels like her abdomen is bloated.  She denies any vaginal bleeding.

## 2012-08-28 NOTE — ED Provider Notes (Addendum)
History     CSN: [id number]  Arrival date & time 08/28/12  2148   First MD Initiated Contact with Patient 08/28/12 2300      Chief Complaint  Patient presents with  . Shortness of Breath    (Consider location/radiation/quality/duration/timing/severity/associated sxs/prior treatment) Patient is a 27 y.o. female presenting with shortness of breath. The history is provided by the patient. No language interpreter was used.  Shortness of Breath Severity:  Moderate Onset quality:  Gradual Timing:  Constant Progression:  Unchanged Chronicity:  New Context comment:  Had egg retrieval procedure this am Relieved by:  Nothing Worsened by:  Nothing tried Ineffective treatments:  None tried Associated symptoms: no fever and no sore throat   Risk factors: no recent alcohol use     Past Medical History  Diagnosis Date  . Aortic stenosis     "mild"- born with it- sees cardiologist once yr  . Asthma   . Anxiety     Past Surgical History  Procedure Laterality Date  . No past surgeries    . Wisdom tooth extraction    . Laparotomy  12/09/2011    Procedure: EXPLORATORY LAPAROTOMY;  Surgeon: Meriel Pica, MD;  Location: [facility];  Service: Gynecology;  Laterality: N/A;  . Salpingoophorectomy  12/09/2011    Procedure: SALPINGO OOPHERECTOMY;  Surgeon: Meriel Pica, MD;  Location: [facility];  Service: Gynecology;  Laterality: Right;    History reviewed. No pertinent family history.  History  Substance Use Topics  . Smoking status: Never Smoker   . Smokeless tobacco: Never Used  . Alcohol Use: No    OB History   Grav Para Term Preterm Abortions TAB SAB Ect Mult Living                  Review of Systems  Constitutional: Negative for fever.  HENT: Negative for sore throat.   Respiratory: Positive for shortness of breath.   Cardiovascular: Negative for palpitations and leg swelling.  All other systems reviewed and are negative.    Allergies  Ceclor and Septra  Home  Medications   Current Outpatient Rx  Name  Route  Sig  Dispense  Refill  . albuterol (PROVENTIL HFA;VENTOLIN HFA) 108 (90 BASE) MCG/ACT inhaler   Inhalation   Inhale 2 puffs into the lungs every 4 (four) hours as needed for wheezing.   1 Inhaler   3   . albuterol (PROVENTIL) (2.5 MG/3ML) 0.083% nebulizer solution   Nebulization   Take 2.5 mg by nebulization every 6 (six) hours as needed. Uses about twice/year for severe cases         . citalopram (CELEXA) 20 MG tablet   Oral   Take 20 mg by mouth daily.         . fluticasone-salmeterol (ADVAIR HFA) 115-21 MCG/ACT inhaler   Inhalation   Inhale 1 puff into the lungs daily.          Marland Kitchen loratadine (CLARITIN) 10 MG tablet   Oral   Take 10 mg by mouth daily.           BP 93/71  Pulse 90  Temp(Src) 97.6 F (36.4 C) (Oral)  Resp 18  Ht 5\' 5"  (1.651 m)  Wt 125 lb (56.7 kg)  BMI 20.8 kg/m2  SpO2 100%  Physical Exam  Constitutional: She is oriented to person, place, and time. She appears well-developed and well-nourished. No distress.  HENT:  Head: Normocephalic and atraumatic.  Eyes: Conjunctivae are normal. Pupils are  equal, round, and reactive to light.  Neck: Normal range of motion. Neck supple.  Cardiovascular: Normal rate, regular rhythm and intact distal pulses.   Pulmonary/Chest: Effort normal and breath sounds normal. No respiratory distress. She has no wheezes. She has no rales.  Abdominal: Soft. Bowel sounds are normal. There is no rebound and no guarding.  Musculoskeletal: Normal range of motion.  Neurological: She is alert and oriented to person, place, and time.  Skin: Skin is warm and dry.  Psychiatric: She has a normal mood and affect.    ED Course  Procedures (including critical care time)  Labs Reviewed  PREGNANCY, URINE  CBC WITH DIFFERENTIAL  BASIC METABOLIC PANEL  TROPONIN I   No results found.   No diagnosis found.    MDM   Date: 08/28/2012  Rate: 80  Rhythm: normal sinus  rhythm  QRS Axis: normal  Intervals: normal  ST/T Wave abnormalities: normal  Conduction Disutrbances: none  Narrative Interpretation: unremarkable   Case d/[facility].  Rule out for cardiac and pulmonary etiology of symptoms.  Pregnancy test was negative but patient was given large HCG injection prior to harvest of eggs as part of procedure.    225 Case d/w Dr. Ladean Raya of [facility], it is unusual to see hyperstimulation post egg retrieval [facility]. Case d/w nurse practioner in the [facility]  Will send to [facility] for further evaluation.  Patient and husband are amenable to same      Adam Sanjuan K Jaxyn Mestas-Rasch, MD 08/29/12 2841  Jasmine Awe, MD 08/29/12 [phone]

## 2012-08-28 NOTE — ED Notes (Signed)
Pt had ivf egg retrieval this am, this afternoon around 8 she developed sudden onset of chest pain and shortness of breath

## 2012-08-29 ENCOUNTER — Encounter (HOSPITAL_BASED_OUTPATIENT_CLINIC_OR_DEPARTMENT_OTHER): Payer: Self-pay

## 2012-08-29 ENCOUNTER — Emergency Department (HOSPITAL_BASED_OUTPATIENT_CLINIC_OR_DEPARTMENT_OTHER): Payer: Managed Care, Other (non HMO)

## 2012-08-29 MED ORDER — SODIUM CHLORIDE 0.9 % IV BOLUS (SEPSIS)
500.0000 mL | Freq: Once | INTRAVENOUS | Status: AC
  Administered 2012-08-29: 500 mL via INTRAVENOUS

## 2012-08-29 MED ORDER — IOHEXOL 350 MG/ML SOLN
100.0000 mL | Freq: Once | INTRAVENOUS | Status: AC | PRN
  Administered 2012-08-29: 100 mL via INTRAVENOUS

## 2012-08-29 MED ORDER — FENTANYL CITRATE 0.05 MG/ML IJ SOLN
50.0000 ug | Freq: Once | INTRAMUSCULAR | Status: AC
  Administered 2012-08-29: 50 ug via INTRAVENOUS
  Filled 2012-08-29: qty 2

## 2012-08-29 MED ORDER — NAPROXEN 375 MG PO TABS: 375.0000 mg | ORAL_TABLET | Freq: Two times a day (BID) | ORAL | Status: DC

## 2012-08-29 NOTE — ED Notes (Addendum)
Patient went to restroom to provide urine sample, pt says she became lightheaded and weak. Pt pale, assisted her back to the room via wheelchair. Placed back on the monitor, pressure 96/61. Notified EDP of the same, order for 500 NS bolus. Dr. Nicanor Alcon in room to discuss plan of care. Pt also reports having bleeding "more than spotting"

## 2012-08-29 NOTE — ED Notes (Signed)
MD at bedside to discuss plan of care

## 2013-01-05 ENCOUNTER — Other Ambulatory Visit (HOSPITAL_COMMUNITY): Payer: Self-pay | Admitting: Cardiology

## 2013-01-05 DIAGNOSIS — Q231 Congenital insufficiency of aortic valve: Secondary | ICD-10-CM

## 2013-01-05 DIAGNOSIS — M314 Aortic arch syndrome [Takayasu]: Secondary | ICD-10-CM

## 2013-01-07 ENCOUNTER — Encounter: Payer: Self-pay | Admitting: Cardiology

## 2013-01-12 ENCOUNTER — Ambulatory Visit (HOSPITAL_COMMUNITY): Admission: RE | Admit: 2013-01-12 | Payer: Managed Care, Other (non HMO) | Source: Ambulatory Visit

## 2013-01-12 ENCOUNTER — Ambulatory Visit (HOSPITAL_COMMUNITY)
Admission: RE | Admit: 2013-01-12 | Discharge: 2013-01-12 | Disposition: A | Discharge status: Home / Self Care | Payer: Managed Care, Other (non HMO) | Source: Ambulatory Visit | Attending: Cardiology | Admitting: Cardiology

## 2013-01-12 DIAGNOSIS — I77819 Aortic ectasia, unspecified site: Secondary | ICD-10-CM | POA: Insufficient documentation

## 2013-01-12 DIAGNOSIS — Q231 Congenital insufficiency of aortic valve: Secondary | ICD-10-CM | POA: Insufficient documentation

## 2013-01-12 DIAGNOSIS — M314 Aortic arch syndrome [Takayasu]: Secondary | ICD-10-CM

## 2013-01-12 MED ORDER — GADOBENATE DIMEGLUMINE 529 MG/ML IV SOLN
20.0000 mL | Freq: Once | INTRAVENOUS | Status: AC
  Administered 2013-01-12: 20 mL via INTRAVENOUS

## 2013-01-15 ENCOUNTER — Encounter: Payer: Managed Care, Other (non HMO) | Admitting: Surgery

## 2013-01-15 DIAGNOSIS — I350 Nonrheumatic aortic (valve) stenosis: Secondary | ICD-10-CM | POA: Insufficient documentation

## 2013-01-22 ENCOUNTER — Institutional Professional Consult (permissible substitution) (INDEPENDENT_AMBULATORY_CARE_PROVIDER_SITE_OTHER): Payer: Managed Care, Other (non HMO) | Admitting: Cardiothoracic Surgery

## 2013-01-22 VITALS — BP 102/68 | HR 77 | Resp 16 | Ht 65.0 in | Wt 125.0 lb

## 2013-01-22 DIAGNOSIS — I712 Thoracic aortic aneurysm, without rupture, unspecified: Secondary | ICD-10-CM

## 2013-01-22 NOTE — Progress Notes (Signed)
[address]       [place] [address]             [phone]                    [place] [facility] Medical Record #[id number] Date of Birth: 03/15/1986  Referring: Quintella Reichert, MD Primary Care: Darrow Bussing, MD Fertility Dr Orion [facility] Dr Chevis Pretty  Chief Complaint:    Chief Complaint  Patient presents with  . TAA    eval and treat    History of Present Illness:    Patient is 27 yo female referred urgently by Dr Mayford Knife. 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 She was followed by Pediatric cardiology since birth and never required any surgical  Intervention. She notes over the years she has many echo cardiograms, but we do not have the results of these. A MRI of the chest in 2012  demonstrated a dilated aorta to 3.9 cm. She is asymptomatic, denies chest pain, sob, heart failure symptoms, no syncope or near syncope. She has no family history of cardiac disease, dissections, aortic aneurysms, brain aneurysms or early death for unexplained reasons. One sister has scleroderma.   The patient now comes in today to discuss the dialed aorta because she has been trying to get pregnant for several years and now under going invitro-fertilization  . She notes the plan is to implant  Embryo in next week or so.  Current Activity/ Functional Status:  Patient is independent with mobility/ambulation, transfers, ADL's, IADL's.  Zubrod Score: At the time of surgery this patient's most appropriate activity status/level should be described as: [x]  Normal activity, no symptoms []  Symptoms, fully ambulatory []  Symptoms, in bed less than or equal to 50% of the time []  Symptoms, in bed greater than 50% of the time but less than 100% []  Bedridden []  Moribund   Past Medical History  Diagnosis Date  . Aortic stenosis     "mild"- born with it- sees cardiologist once yr  . Asthma   . Anxiety   . Depression   . Aortic aneurysm      Past Surgical History  Procedure Laterality Date  . No past surgeries    . Wisdom tooth extraction    . Laparotomy  12/09/2011    Procedure: EXPLORATORY LAPAROTOMY;  Surgeon: Meriel Pica, MD;  Location: [facility];  Service: Gynecology;  Laterality: N/A;  . Salpingoophorectomy  12/09/2011    Procedure: SALPINGO OOPHERECTOMY;  Surgeon: Meriel Pica, MD;  Location: [facility];  Service: Gynecology;  Laterality: Right;    Family History  Problem Relation Age of Onset  . Scleroderma Sister     History   Social History  . Marital Status: Married    Spouse Name: N/A    Number of Children: none  . Years of Education: Maters in Estate agent   Occupational History  . interior designer     [employer]   Social History Main Topics  . Smoking status: Never Smoker   . Smokeless tobacco: Never Used  . Alcohol Use: Yes     Comment: beer- rare  . Drug Use: No  . Sexually Active: Yes    Birth Control/ Protection: None      History  Smoking status  . Never Smoker   Smokeless tobacco  . Never Used    History  Alcohol Use  . Yes  Comment: beer- rare     Allergies  Allergen Reactions  . Ceclor (Cefaclor) Hives  . Septra (Bactrim) Hives    Current Outpatient Prescriptions  Medication Sig Dispense Refill  . albuterol (PROVENTIL) (2.5 MG/3ML) 0.083% nebulizer solution Take 2.5 mg by nebulization every 6 (six) hours as needed. Uses about twice/year for severe cases      . estradiol (ESTRACE) 2 MG tablet Take 2 mg by mouth 3 (three) times daily.      . fluticasone-salmeterol (ADVAIR HFA) 115-21 MCG/ACT inhaler Inhale 1 puff into the lungs daily.       Marland Kitchen loratadine (CLARITIN) 10 MG tablet Take 10 mg by mouth daily.      . naproxen (NAPROSYN) 375 MG tablet Take 1 tablet (375 mg total) by mouth 2 (two) times daily.  20 tablet  0  . albuterol (PROVENTIL HFA;VENTOLIN HFA) 108 (90 BASE) MCG/ACT inhaler Inhale 2 puffs into the lungs every 4 (four) hours as needed for  wheezing.  1 Inhaler  3   No current facility-administered medications for this visit.       Review of Systems:     Cardiac Review of Systems: Y or N  Chest Pain [  n  ]  Resting SOB [n   ] Exertional SOB  [ x asthma ]  Orthopnea [ n ]   Pedal Edema [ n  ]    Palpitations [n  ] Syncope  [n  ]   Presyncope [ n  ]  General Review of Systems: [Y] = yes [  ]=no Constitional: recent weight change [ n ]; anorexia [ n ]; fatigue [n  ]; nausea [  n]; night sweats [  ]; fever [  ]; or chills [  ];                                                                                                                                          Dental: poor dentition[ n ]; Last Dentist visit: July 2013  Eye : blurred vision [  ]; diplopia [   ]; vision changes [  ];  Amaurosis fugax[  ]; Resp: cough [  ];  wheezing[  ];  hemoptysis[ n ]; shortness of breath[  ]; paroxysmal nocturnal dyspnea[  ]; dyspnea on exertion[  ]; or orthopnea[  ];  GI:  gallstones[  ], vomiting[  ];  dysphagia[  ]; melena[  ];  hematochezia [  ]; heartburn[  ];   Hx of  Colonoscopy[  ]; GU: kidney stones [  ]; hematuria[  ];   dysuria [  ];  nocturia[  ];  history of     obstruction [  ]; urinary frequency [  ]             Skin: rash, swelling[  ];, hair loss[  ];  peripheral edema[  ];  or itching[  ];  Musculosketetal: myalgias[  ];  joint swelling[  ];  joint erythema[  ];  joint pain[  ];  back pain[  ];  Heme/Lymph: bruising[  ];  bleeding[  ];  anemia[  ];  Neuro: TIA[  ];  headaches[n  ];  stroke[n  ];  vertigo[  ];  seizures[ n ];   paresthesias[  ];  difficulty walking[  ];  Psych:depression[  ]; anxiety[ n ];  Endocrine: diabetes[ n ];  thyroid dysfunction[ n ];  Immunizations: Flu [ n ]; Pneumococcal[n  ];  Other:  Physical Exam: BP 102/68  Pulse 77  Resp 16  Ht 5\' 5"  (1.651 m)  Wt 125 lb (56.7 kg)  BMI 20.8 kg/m2  SpO2 98%  General appearance: alert, cooperative and appears stated age Neurologic: intact Heart:  regular rate and rhythm, S1, S2 normal, II/VI systolic murmur at left sternal border, click, rub or gallop Lungs: clear to auscultation bilaterally and normal percussion bilaterally Abdomen: soft, non-tender; bowel sounds normal; no masses,  no organomegaly Extremities: extremities normal, atraumatic, no cyanosis or edema and Homans sign is negative, no sign of DVT no sign of marfan's    Diagnostic Studies & Laboratory data:     Recent Radiology Findings:  Mr Maxine Glenn Chest W Contrast  01/14/2013   Cardiac MRI/ Aortic MRA  Indication: Bicuspid AV  Protocol:  The patient was scanned on a 1.5 Tesla GE magnet a dedicated cardiac coil was used. Functional imaging was done using Fiesta sequences. Aortic root measurements were made from Fiesta, IIR and MRA images.  The patient received gadolinium.  Quantitative EF was calculated using Simpsons method on a dedicated Circle work station. Flow imaging was used to assess AS/AR  Findings:  LV cavity size and function were normal. EF 53 % ( EDV 124cc, ESV 59cc SV 65cc) There was no scar or delyes enhancement.  The LA/RA and RV were normal There was no ASD/VSD or pericardial effusion.  There was a bicuspid AV.  There was turbulence through the valve on cine images. Peak gradient on flow imaging was 14 mmHg  Planimetry of valve area was 2cm2 The regurgitant fraction was only 7% These findings would indicate mild AS/AR  MRA:  The ascending aortic root was moderately dilated.  The great vessels arose normally from the arch. There was no coarctation.  Ascending Aortic Root: 4.1 cm Aortic Arch 1.8 cm Descending Thoracic Aorta: 1.7 cm  Impression:     1)    Bicuspid AV with mild AS/AR Echo correlation suggested        2)    Normal LV size and function EF 53% 3)    Moderate Ascending Aortic root dilatation 4.1 cm at level of RMPA relative to a descending thoracic aorta of only 1.7 cm Suggest Echo in a year and aortic MRA with IIR imaging in 6 months  Charlton Haws MD [facility]    Original Report Authenticated By: Charlton Haws, M.D.   CT of the Chest 08/2012:  Jake Shark L. Margo Aye, MD [facility] Jan 15, 2013 2:24:40 PM EDT       **ADDENDUM** CREATED: 2013-01-15 14:24:38  Study reviewed on 01/15/2013.  There is fusiform enlargement of the ascending aorta up to 39 - 40  mm by CTA (see coronal image 40 and series 4 image 42). This  tapers in the region of the proximal aortic arch. The distal arch  and descending thoracic aortic caliber then appear normal.  This corresponds to the finding on the subsequent cardiac  MRI  01/12/2013, and this finding was also noted on cardiac MRI dated  09/04/2010 (measured to be 39 mm diameter at that time).  Study discussed by telephone with Dr. Tyrone Sage on 01/15/2013 at  1415 hours.  **END ADDENDUM** SIGNED BY: H.LEE HALL III, M.D.      Study Result    *RADIOLOGY REPORT*  Clinical Data: 27 year old female undergoing IVF with egg retrieval  this AM. Sudden onset chest pain and shortness of breath. Pain  radiating to the right shoulder.  CT ANGIOGRAPHY CHEST  Technique: Multidetector CT imaging of the chest using the  standard protocol during bolus administration of intravenous  contrast. Multiplanar reconstructed images including MIPs were  obtained and reviewed to evaluate the vascular anatomy.  Contrast: OMNIPAQUE IOHEXOL 350 MG/ML SOLN  Comparison: Chest radiographs 04/02/2006.  Findings: Adequate contrast bolus timing in the pulmonary arterial  tree. Occasional mild respiratory motion. Streak artifact from  dense contrast in the SVC. No focal filling defect identified in  the pulmonary arterial tree to suggest the presence of acute  pulmonary embolism.  Major airways are patent. No abnormal pulmonary opacity. No  pleural or pericardial effusion.  Negative visualized aorta and great vessels. Negative thoracic  inlet. Small volume residual thymus in the mediastinum. No  mediastinal lymphadenopathy.  Upper abdomen remarkable  for small volume free fluid adjacent to  the liver and spleen. Small volume fluid in Morison's pouch.  Excreted IV contrast in the visible renal collecting systems.  No acute osseous abnormality identified.  IMPRESSION:  1. No evidence of acute pulmonary embolus. No acute findings  identified in the chest. 2. Small volume ascites in the upper  abdomen. Consider ovarian hyperstimulation syndrome in this  setting.  Original Report Authenticated By: Erskine Speed, M.D.    09/05/2010: Clinical Data: Evaluate for aortic coarctation  MR CARDIA MORPHOLOGY WITHOUT CONTRAST  GE 1.5 T magnet with dedicated cardiac coil. FIESTA sequences for  function and morphology. MR angiography was done using Multihance  contrast. EF was calculated at a dedicated workstation.  Comparison: None.  Findings: The left ventricle was normal in size with no LV  hypertrophy. Normal wall motion. EF was calculated to be 68%.  Normal right ventricular size and systolic function. The left and  right atria were normal in size. The interatrial septum appeared  intact. There was no significant mitral regurgitation. The aortic  valve was congenitally abnormal and probably functionally bicuspid  with what appeared to be fusion of the raphe between the left and  right coronary cusps. There did not appear to be signficant aortic  stenosis. There was probably trivial aortic insufficiency.  On MR angiography, the ascending aorta was dilated to about 3.9 cm.  The remainder of the thoracic aorta was normal in caliber. There  was no aortic coarctation. The arch vessels originated normally.  The pulmonary veins drained normally to the left atrium.  Measurements:  LV EDV 128 mL  LV SV 87 mL  LV EF 68%  Aortic root 3.1 cm  Ascending aorta at maximal dimension 3.9 cm  Arch 2.0 cm  Isthmus 1.5 cm  Descending thoracic aorta 1.7 cm  IMPRESSION:  1. Normal LV and RV size and systolic function.  2. Congenitally abnormal aortic valve  that appeared to be  functionally bicuspid with fusion of the raphe between left and  right coronary cusps. Would confirm this by echo as there was  signficant MRI artifact around the area of the aortic valve. There  did  not appear to be signficant aortic stenosis and there was  probably trivial aortic insufficiency.  3. There was dilation of the ascending aorta to 3.9 cm. This  could be due to aortopathy related to bicuspid aortic valve  disorder. There was no significant aortic coarctation.  Original Report Authenticated By: [id number]  Recent Lab Findings: Lab Results  Component Value Date   WBC 11.1* 08/28/2012   HGB 10.4* 08/28/2012   HCT 31.1* 08/28/2012   PLT 246 08/28/2012   GLUCOSE 109* 08/28/2012   NA 136 08/28/2012   K 3.8 08/28/2012   CL 101 08/28/2012   CREATININE 0.72 01/12/2013   BUN 8 01/12/2013   CO2 25 08/28/2012   Aortic Size Index=      4.0   /Body surface area is 1.61 meters squared. = 2.48  < 2.75 cm/m2      4% risk per year 2.75 to 4.25          8% risk per year > 4.25 cm/m2    20% risk per year This data not specific for  Pregnancy   cross sectional area of aorta cm2/height in meters > 10 indication for surgery 12.56/1.65= 7.6    Assessment / Plan:     Patient with congential dysplastic aortic vale and dilated ascending aorta 3.9-4.1 cm with no history or family history of aortic disease. Her aortic valve appears on scans to be functionally   bicuspid.  No genetic testing or consultation  for mutations in   genes known to predispose to thoracic aortic aneurysms and dissections,such as FBN1, TGFBR1, TGFBR2, ACTA2,and MYH1 has been done.   I had more then  conversation with the patient and husband about aortic valve disease  and aortic aneurysm  disease, risk of dissection and rupture. Some prediction of risk by size of aorta and patient and rate of change was reviewed with them. I have discussed with them that pregnancy will definitely  increased these  risks but at a difficulty to quantify rate.    I have shared with them the  Recommendations from :  2010 ACCF/AHA/AATS/ACR/ASA/SCA/SCAI/SIR/STS/SVM Guidelines for the Diagnosis and Management of Patients With Thoracic Aortic Disease  20. Recommendations for Counseling and Management of Chronic Aortic Diseases in Pregnancy Class I 1. Women with Marfan syndrome and aortic dilatation, as well as patients without Marfan syndrome who have known aortic disease, should be counseled about the risk of aortic dissection as well as the heritable nature of the disease prior to pregnancy. 40,141 (Level of Evidence: C)  2. For pregnant women with known thoracic aortic dilatation or a familial or genetic predisposition for aortic dissection, strict blood pressure control specifically to prevent Stage II hypertension, is recommended. (Level of Evidence: C)  3. For all pregnant women with known aortic root or ascending aortic dilatation, monthly or bimonthly echocardiographic measurements of the ascending aortic dimensions are recommended to detect aortic expansion until birth. (Level of Evidence: C)  4. For imaging of pregnant women with aortic arch, descending, or abdominal aortic dilatation, magnetic resonance imaging (without gadolinium) is recommended over computed tomographic imaging to avoid exposing both the mother and fetus to ionizing radiation. Transesophageal echocardiogram is an option for imaging of the thoracic aorta. (Level of Evidence: C)  5. Pregnant women with aortic aneurysms should be delivered where cardiothoracic surgery is available. (Level of Evidence: C)  Class IIa 1. Fetal delivery via cesarean section is reasonable for patients with significant aortic enlargement, dissection, or severe aortic valve regurgitation.141 (Level  of Evidence: C) Class IIb  1. If progressive aortic dilatation and/or advancing aortic valve regurgitation are documented,  prophylactic surgery may be considered.142 (Level of Evidence: C)  I will forward this information to her treating physicians. I will plan to see her back in two months  Delight Ovens MD      [address] [place] [place] [address] Office [phone]   Beeper [phone]  01/22/2013 5:12 PM

## 2013-02-18 ENCOUNTER — Other Ambulatory Visit: Payer: Self-pay

## 2013-03-01 DIAGNOSIS — I350 Nonrheumatic aortic (valve) stenosis: Secondary | ICD-10-CM

## 2013-03-01 HISTORY — DX: Nonrheumatic aortic (valve) stenosis: I35.0

## 2013-03-23 ENCOUNTER — Ambulatory Visit (HOSPITAL_COMMUNITY)
Admission: RE | Admit: 2013-03-23 | Discharge: 2013-03-23 | Disposition: A | Discharge status: Home / Self Care | Payer: Managed Care, Other (non HMO) | Source: Ambulatory Visit | Attending: Obstetrics and Gynecology | Admitting: Obstetrics and Gynecology

## 2013-03-23 ENCOUNTER — Encounter (HOSPITAL_COMMUNITY): Payer: Self-pay

## 2013-03-23 ENCOUNTER — Other Ambulatory Visit: Payer: Self-pay | Admitting: *Deleted

## 2013-03-23 VITALS — BP 122/86 | HR 104 | Wt 130.0 lb

## 2013-03-23 DIAGNOSIS — Q231 Congenital insufficiency of aortic valve: Secondary | ICD-10-CM | POA: Insufficient documentation

## 2013-03-23 DIAGNOSIS — Q289 Congenital malformation of circulatory system, unspecified: Secondary | ICD-10-CM | POA: Insufficient documentation

## 2013-03-23 DIAGNOSIS — I2510 Atherosclerotic heart disease of native coronary artery without angina pectoris: Secondary | ICD-10-CM | POA: Insufficient documentation

## 2013-03-23 DIAGNOSIS — O99419 Diseases of the circulatory system complicating pregnancy, unspecified trimester: Secondary | ICD-10-CM | POA: Insufficient documentation

## 2013-03-23 DIAGNOSIS — I77810 Thoracic aortic ectasia: Secondary | ICD-10-CM | POA: Insufficient documentation

## 2013-03-23 NOTE — Progress Notes (Addendum)
MATERNAL FETAL MEDICINE CONSULT  Patient Name: Leslie Sutton Medical Record Number:  [id number] Date of Birth: 05-16-1986 Requesting Physician Name:  Meriel Pica, MD Date of Service: 03/23/2013  Chief Complaint Maternal bicuspid aortic valve and ascending aortic root dilation  History of Present Illness Leslie Sutton was seen today for prenatal diagnosis secondary to Maternal bicuspid aortic valve and ascending aortic root dilation at the request of Dr. Marcelle Overlie.  The patient is a 27 y.o. G1P0,at [redacted]w[redacted]d with an EDD of 10/20/2013, by IVF dating.  Leslie Sutton denies any functional concerns with Leslie heart disease.  She reports normal exercise tolerance and has been asymptomatic throughout Leslie life.  She had an MRI and echocardiogram in July and saw Dr. Tyrone Sage at that time.  Per his notation Leslie MRI showed 1) Bicuspid AV with mild AS/AR Echo correlation suggested 2) Normal LV size and function EF 53% 3) Moderate Ascending Aortic root dilatation 4.1 cm at level of RMPA relative to a descending thoracic aorta of only 1.7 cm Suggest Echo in a year and aortic MRA with IIR imaging in 6 months.  This was however prior to pregnancy.  She was advised at that time of increased risk in pregnancy of dissection, aneurysm or rupture as well as the need for q1-2 month echocardiograms during pregnancy.  She has an appointment with Dr. Tyrone Sage for follow up this Thursday.  She does not currently have an echo scheduled.     Review of Systems A comprehensive review of systems was negative.  Patient History OB History  Gravida Para Term Preterm AB SAB TAB Ectopic Multiple Living  1             # Outcome Date GA Lbr Len/2nd Weight Sex Delivery Anes PTL Lv  1 CUR               Past Medical History  Diagnosis Date  . Aortic stenosis     "mild"- born with it- sees cardiologist once yr  . Asthma   . Anxiety   . Depression   . Aortic aneurysm     Past Surgical History  Procedure Laterality Date  . No  past surgeries    . Wisdom tooth extraction    . Laparotomy  12/09/2011    Procedure: EXPLORATORY LAPAROTOMY;  Surgeon: Meriel Pica, MD;  Location: [facility];  Service: Gynecology;  Laterality: N/A;  . Salpingoophorectomy  12/09/2011    Procedure: SALPINGO OOPHERECTOMY;  Surgeon: Meriel Pica, MD;  Location: [facility];  Service: Gynecology;  Laterality: Right;    History   Social History  . Marital Status: Married    Sutton Name: N/A    Number of Children: N/A  . Years of Education: N/A   Occupational History  . interior designer     [employer]   Social History Main Topics  . Smoking status: Never Smoker   . Smokeless tobacco: Never Used  . Alcohol Use: Yes     Comment: beer- rare  . Drug Use: No  . Sexual Activity: Yes    Birth Control/ Protection: None   Other Topics Concern  . None   Social History Narrative  . None    Family History  Problem Relation Age of Onset  . Scleroderma Sister    In addition, the patient has no family history of mental retardation, birth defects, or genetic diseases.  Physical Examination Filed Vitals:   03/23/13 0817  BP: 122/86  Pulse: 104  General appearance - alert, well appearing, and in no distress  Assessment and Recommendations Leslie Sutton is a 27 yo G1 @ [redacted]w[redacted]d via IVF dating with a singleton intrauterine pregnancy and a maternal history of bicuspid aortic valve and ascending aortic root dilation (4.1cm)   1. Maternal cardiac disease-  Leslie Sutton is advised to keep Leslie appointment with Dr. Tyrone Sage in cardiology this Thursday.  From a maternal standpoint she is counseled that she is at increased risk of antenatal complications.  While mild aortic stenosis can be well tolerated in pregnancy the aortic root dilation is particularly concerning.  She is advised of increased risk of progression of dilation, dissection, aneurysm and rupture in pregnancy.   A review of 1 women with pre- or post-partum aortic dissection,  including four with a bicuspid aortic valve and prepartum ascending aortic dissection, disclosed that aortic root enlargement >40 mm and/or an increase in aortic root size during pregnancy were risk factors for type A dissection.   (Aortic dissection in pregnancy: analysis of risk factors and outcome. Immer FF, Bansi AG, Immer-Bansi AS, McDougall J, Zehr KJ, Schaff HV, Carrel TP.  Ann Thorac Surg. 2003;76(1):309.).    She is advised of the need to do q1-2 month echocardiograms as per Cardiology's recommendation.  She is advised that should further imaging be necessary MRI is preferred to CT during pregnancy due to the lack of ionizing radiation.  She is advised of increased risk in pregnancy of arrhythmias, myocardial infarction or stroke (~2.5% in one literature review) as well as increased risk for heart failure (~7.5%).    Blood pressure should be carefully monitored and kept below severe range (160/110).  Anesthesia consult in the 3rd trimester is advised as dramatic shifts in blood pressure should be avoided.  Passive second stage delivery should be reasonable and is may be preferable to a cesarean due to less blood loss and hemodynamic shifts but will need to be readdressed in the 3rd trimester.    2. Fetal implications-  Leslie Sutton is counseled that in cases of maternal congenital heart disease there is an increased incidence of fetal heart disease.  This incidence is typically ~5% and can be any defect, not necessarily the same defect the mother has.  Due to this I have recommended a first trimester screen for aneuploidy screening should she desire it but also due to increased incidence in fetal congenital heart disease if the fetus has an elevated nuchal translucency.  She should also have a fetal echocardiogram between 18-20 weeks.    Thank you for allowing me to participate in the care of your patient, Leslie Sutton.    I have spent 60 minutes in coordination of care with >50% of which devoted to  face to face counseling on the above with Leslie Sutton and Leslie Sutton.  Their questions are answered at this time.   Additionally I have discussed this case and recommendations with Dr. Alpha Gula MD.   Molly Maduro, MD Maternal Fetal Medicine Fellow

## 2013-03-23 NOTE — Addendum Note (Signed)
Encounter addended by: Molly Maduro, MD on: 03/23/2013  2:46 PM<BR>     Documentation filed: Notes Section

## 2013-03-24 NOTE — Addendum Note (Signed)
Encounter addended by: Ty Hilts, RN on: 03/24/2013  8:28 AM<BR>     Documentation filed: Charges VN

## 2013-03-25 ENCOUNTER — Ambulatory Visit (INDEPENDENT_AMBULATORY_CARE_PROVIDER_SITE_OTHER): Payer: Managed Care, Other (non HMO) | Admitting: Cardiothoracic Surgery

## 2013-03-25 ENCOUNTER — Encounter: Payer: Self-pay | Admitting: Cardiothoracic Surgery

## 2013-03-25 VITALS — BP 108/61 | HR 60 | Resp 18 | Ht 65.0 in | Wt 129.0 lb

## 2013-03-25 DIAGNOSIS — O09511 Supervision of elderly primigravida, first trimester: Secondary | ICD-10-CM

## 2013-03-25 DIAGNOSIS — I712 Thoracic aortic aneurysm, without rupture, unspecified: Secondary | ICD-10-CM

## 2013-03-25 NOTE — Progress Notes (Signed)
[address]       [place] [address]             [phone]                     Leslie Sutton [facility] Medical Record #[id number] Date of Birth: [birth year]  Referring: Darrow Bussing, MD Primary Care: Darrow Bussing, MD Fertility Dr Orion Modest [facility] Dr Marcelle Overlie  Chief Complaint:    Dilated aorta   History of Present Illness:    Patient is 27 yo female referred urgently by Dr Mayford Knife. 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 She was followed by Pediatric cardiology since birth and never required any surgical  Intervention. She notes over the years she has many echo cardiograms, but we do not have the results of these. A MRI of the chest in 2012  demonstrated a dilated aorta to 3.9 cm. She is asymptomatic, denies chest pain, sob, heart failure symptoms, no syncope or near syncope. She has no family history of cardiac disease, dissections, aortic aneurysms, brain aneurysms or early death for unexplained reasons. One sister has scleroderma.   The patient was seen two months ago to discuss the dilated  Aorta.  She returns today in follow up now [redacted] weeks pregnant. Her only complaint is some increased need to use her inhalers at night because of waking sob and mild wheezing. She has no onther symptoms of heart failure or AS.  Current Activity/ Functional Status:  Patient is independent with mobility/ambulation, transfers, ADL's, IADL's.  Zubrod Score: At the time of surgery this patient's most appropriate activity status/level should be described as: [x]  Normal activity, no symptoms []  Symptoms, fully ambulatory []  Symptoms, in bed less than or equal to 50% of the time []  Symptoms, in bed greater than 50% of the time but less than 100% []  Bedridden []  Moribund   Past Medical History  Diagnosis Date  . Aortic stenosis     "mild"- born with it- sees cardiologist once yr  . Asthma   . Anxiety   . Depression   . Aortic  aneurysm     Past Surgical History  Procedure Laterality Date  . No past surgeries    . Wisdom tooth extraction    . Laparotomy  12/09/2011    Procedure: EXPLORATORY LAPAROTOMY;  Surgeon: Meriel Pica, MD;  Location: [facility];  Service: Gynecology;  Laterality: N/A;  . Salpingoophorectomy  12/09/2011    Procedure: SALPINGO OOPHERECTOMY;  Surgeon: Meriel Pica, MD;  Location: [facility];  Service: Gynecology;  Laterality: Right;    Family History  Problem Relation Age of Onset  . Scleroderma Sister     History   Social History  . Marital Status: Married    Spouse Name: N/A    Number of Children: none  . Years of Education: Maters in Estate agent   Occupational History  . interior designer     [employer]   Social History Main Topics  . Smoking status: Never Smoker   . Smokeless tobacco: Never Used  . Alcohol Use: Yes     Comment: beer- rare  . Drug Use: No  . Sexually Active: Yes    Birth Control/ Protection: None      History  Smoking status  . Never Smoker   Smokeless tobacco  . Never Used    History  Alcohol Use  . Yes  Comment: beer- rare     Allergies  Allergen Reactions  . Ceclor [Cefaclor] Hives  . Septra [Bactrim] Hives    Current Outpatient Prescriptions  Medication Sig Dispense Refill  . albuterol (PROVENTIL HFA;VENTOLIN HFA) 108 (90 BASE) MCG/ACT inhaler Inhale 2 puffs into the lungs every 4 (four) hours as needed for wheezing.  1 Inhaler  3  . albuterol (PROVENTIL) (2.5 MG/3ML) 0.083% nebulizer solution Take 2.5 mg by nebulization every 6 (six) hours as needed. Uses about twice/year for severe cases      . estradiol (ESTRACE) 2 MG tablet Take 2 mg by mouth 3 (three) times daily.      . fluticasone-salmeterol (ADVAIR HFA) 115-21 MCG/ACT inhaler Inhale 1 puff into the lungs daily.       Marland Kitchen loratadine (CLARITIN) 10 MG tablet Take 10 mg by mouth daily.      . naproxen (NAPROSYN) 375 MG tablet Take 1 tablet (375 mg total) by mouth 2  (two) times daily.  20 tablet  0  . Prenatal Vit-Fe Fumarate-FA (PRENATAL MULTIVITAMIN) TABS tablet Take 1 tablet by mouth daily at 12 noon.       No current facility-administered medications for this visit.       Review of Systems:     Cardiac Review of Systems: Y or N  Chest Pain [  n  ]  Resting SOB [n   ] Exertional SOB  [ x asthma ]  Orthopnea [ n ]   Pedal Edema [ n  ]    Palpitations [n  ] Syncope  [n  ]   Presyncope [ n  ]  General Review of Systems: [Y] = yes [  ]=no Constitional: recent weight change [ n ]; anorexia [ n ]; fatigue [n  ]; nausea [  n]; night sweats [  ]; fever [  ]; or chills [  ];                                                                                                                                          Dental: poor dentition[ n ]; Last Dentist visit: July 2013  Eye : blurred vision [  ]; diplopia [   ]; vision changes [  ];  Amaurosis fugax[  ]; Resp: cough [  ];  wheezing[  ];  hemoptysis[ n ]; shortness of breath[  ]; paroxysmal nocturnal dyspnea[  ]; dyspnea on exertion[  ]; or orthopnea[  ];  GI:  gallstones[  ], vomiting[  ];  dysphagia[  ]; melena[  ];  hematochezia [  ]; heartburn[  ];   Hx of  Colonoscopy[  ]; GU: kidney stones [  ]; hematuria[  ];   dysuria [  ];  nocturia[  ];  history of     obstruction [  ]; urinary frequency [  ]  Skin: rash, swelling[  ];, hair loss[  ];  peripheral edema[  ];  or itching[  ]; Musculosketetal: myalgias[  ];  joint swelling[  ];  joint erythema[  ];  joint pain[  ];  back pain[  ];  Heme/Lymph: bruising[  ];  bleeding[  ];  anemia[  ];  Neuro: TIA[  ];  headaches[n  ];  stroke[n  ];  vertigo[  ];  seizures[ n ];   paresthesias[  ];  difficulty walking[  ];  Psych:depression[  ]; anxiety[ n ];  Endocrine: diabetes[ n ];  thyroid dysfunction[ n ];  Immunizations: Flu [ n ]; Pneumococcal[n  ];  Other:  Physical Exam :BP 108/61  Pulse 60  Resp 18  Ht 5\' 5"  (1.651 m)  Wt 129 lb (58.514 kg)   BMI 21.47 kg/m2  SpO2 99%  LMP 07/07/2012   General appearance: alert, cooperative and appears stated age Neurologic: intact Heart: regular rate and rhythm, S1, S2 normal, II/VI systolic murmur at left sternal border, click, rub or gallop Lungs: clear to auscultation bilaterally and normal percussion bilaterally Abdomen: soft, non-tender; bowel sounds normal; no masses,  no organomegaly Extremities: extremities normal, atraumatic, no cyanosis or edema and Homans sign is negative, no sign of DVT no sign of marfan's    Diagnostic Studies & Laboratory data:     Recent Radiology Findings:  Mr Leslie Sutton Chest W Contrast  01/14/2013   Cardiac MRI/ Aortic MRA  Indication: Bicuspid AV  Protocol:  The patient was scanned on a 1.5 Tesla GE magnet a dedicated cardiac coil was used. Functional imaging was done using Fiesta sequences. Aortic root measurements were made from Fiesta, IIR and MRA images.  The patient received gadolinium.  Quantitative EF was calculated using Simpsons method on a dedicated Circle work station. Flow imaging was used to assess AS/AR  Findings:  LV cavity size and function were normal. EF 53 % ( EDV 124cc, ESV 59cc SV 65cc) There was no scar or delyes enhancement.  The LA/RA and RV were normal There was no ASD/VSD or pericardial effusion.  There was a bicuspid AV.  There was turbulence through the valve on cine images. Peak gradient on flow imaging was 14 mmHg  Planimetry of valve area was 2cm2 The regurgitant fraction was only 7% These findings would indicate mild AS/AR  MRA:  The ascending aortic root was moderately dilated.  The great vessels arose normally from the arch. There was no coarctation.  Ascending Aortic Root: 4.1 cm Aortic Arch 1.8 cm Descending Thoracic Aorta: 1.7 cm  Impression:     1)    Bicuspid AV with mild AS/AR Echo correlation suggested        2)    Normal LV size and function EF 53% 3)    Moderate Ascending Aortic root dilatation 4.1 cm at level of RMPA relative to  a descending thoracic aorta of only 1.7 cm Suggest Echo in a year and aortic MRA with IIR imaging in 6 months  Charlton Haws MD [facility]   Original Report Authenticated By: Charlton Haws, M.D.   CT of the Chest 08/2012:  Jake Shark L. Margo Aye, MD [facility] Jan 15, 2013 2:24:40 PM EDT    **ADDENDUM** CREATED: 2013-01-15 14:24:38  Study reviewed on 01/15/2013.  There is fusiform enlargement of the ascending aorta up to 39 - 40  mm by CTA (see coronal image 40 and series 4 image 42). This  tapers in the region of the proximal aortic arch. The distal  arch  and descending thoracic aortic caliber then appear normal.  This corresponds to the finding on the subsequent cardiac MRI  01/12/2013, and this finding was also noted on cardiac MRI dated  09/04/2010 (measured to be 39 mm diameter at that time).  Study discussed by telephone with Dr. Tyrone Sage on 01/15/2013 at  1415 hours.  **END ADDENDUM** SIGNED BY: H.LEE HALL III, M.D.   Study Result    *RADIOLOGY REPORT*  Clinical Data: 27 year old female undergoing IVF with egg retrieval  this AM. Sudden onset chest pain and shortness of breath. Pain  radiating to the right shoulder.  CT ANGIOGRAPHY CHEST  Technique: Multidetector CT imaging of the chest using the  standard protocol during bolus administration of intravenous  contrast. Multiplanar reconstructed images including MIPs were  obtained and reviewed to evaluate the vascular anatomy.  Contrast: OMNIPAQUE IOHEXOL 350 MG/ML SOLN  Comparison: Chest radiographs 04/02/2006.  Findings: Adequate contrast bolus timing in the pulmonary arterial  tree. Occasional mild respiratory motion. Streak artifact from  dense contrast in the SVC. No focal filling defect identified in  the pulmonary arterial tree to suggest the presence of acute  pulmonary embolism.  Major airways are patent. No abnormal pulmonary opacity. No  pleural or pericardial effusion.  Negative visualized aorta and great vessels. Negative  thoracic  inlet. Small volume residual thymus in the mediastinum. No  mediastinal lymphadenopathy.  Upper abdomen remarkable for small volume free fluid adjacent to  the liver and spleen. Small volume fluid in Morison's pouch.  Excreted IV contrast in the visible renal collecting systems.  No acute osseous abnormality identified.  IMPRESSION:  1. No evidence of acute pulmonary embolus. No acute findings  identified in the chest. 2. Small volume ascites in the upper  abdomen. Consider ovarian hyperstimulation syndrome in this  setting.  Original Report Authenticated By: Erskine Speed, M.D.    09/05/2010: Clinical Data: Evaluate for aortic coarctation  MR CARDIA MORPHOLOGY WITHOUT CONTRAST  GE 1.5 T magnet with dedicated cardiac coil. FIESTA sequences for  function and morphology. MR angiography was done using Multihance  contrast. EF was calculated at a dedicated workstation.  Comparison: None.  Findings: The left ventricle was normal in size with no LV  hypertrophy. Normal wall motion. EF was calculated to be 68%.  Normal right ventricular size and systolic function. The left and  right atria were normal in size. The interatrial septum appeared  intact. There was no significant mitral regurgitation. The aortic  valve was congenitally abnormal and probably functionally bicuspid  with what appeared to be fusion of the raphe between the left and  right coronary cusps. There did not appear to be signficant aortic  stenosis. There was probably trivial aortic insufficiency.  On MR angiography, the ascending aorta was dilated to about 3.9 cm.  The remainder of the thoracic aorta was normal in caliber. There  was no aortic coarctation. The arch vessels originated normally.  The pulmonary veins drained normally to the left atrium.  Measurements:  LV EDV 128 mL  LV SV 87 mL  LV EF 68%  Aortic root 3.1 cm  Ascending aorta at maximal dimension 3.9 cm  Arch 2.0 cm  Isthmus 1.5 cm  Descending  thoracic aorta 1.7 cm  IMPRESSION:  1. Normal LV and RV size and systolic function.  2. Congenitally abnormal aortic valve that appeared to be  functionally bicuspid with fusion of the raphe between left and  right coronary cusps. Would confirm this by echo  as there was  signficant MRI artifact around the area of the aortic valve. There  did not appear to be signficant aortic stenosis and there was  probably trivial aortic insufficiency.  3. There was dilation of the ascending aorta to 3.9 cm. This  could be due to aortopathy related to bicuspid aortic valve  disorder. There was no significant aortic coarctation.  Original Report Authenticated By: [id number]  Recent Lab Findings: Lab Results  Component Value Date   WBC 11.1* 08/28/2012   HGB 10.4* 08/28/2012   HCT 31.1* 08/28/2012   PLT 246 08/28/2012   GLUCOSE 109* 08/28/2012   NA 136 08/28/2012   K 3.8 08/28/2012   CL 101 08/28/2012   CREATININE 0.72 01/12/2013   BUN 8 01/12/2013   CO2 25 08/28/2012   Aortic Size Index=      4.0   /There is no height or weight on file to calculate BSA. = 2.48  < 2.75 cm/m2      4% risk per year 2.75 to 4.25          8% risk per year > 4.25 cm/m2    20% risk per year This data not specific for  Pregnancy   cross sectional area of aorta cm2/height in meters > 10 indication for surgery 12.56/1.65= 7.6    Assessment / Plan:     Patient with congential dysplastic aortic vale and dilated ascending aorta 3.9-4.1 cm with no history or family history of aortic disease. Her aortic valve appears on scans to be functionally   bicuspid.  No genetic testing or consultation  for mutations for   genes known to predispose to thoracic aortic aneurysms and dissections,such as FBN1, TGFBR1, TGFBR2, ACTA2,and MYH1 has been done.   The   Recommendations from :  2010 ACCF/AHA/AATS/ACR/ASA/SCA/SCAI/SIR/STS/SVM Guidelines for the Diagnosis and Management of Patients With Thoracic Aortic Disease  20. Recommendations for  Counseling and Management of Chronic Aortic Diseases in Pregnancy Class I 1. Women with Marfan syndrome and aortic dilatation, as well as patients without Marfan syndrome who have known aortic disease, should be counseled about the risk of aortic dissection as well as the heritable nature of the disease prior to pregnancy. 40,141 (Level of Evidence: C)  2. For pregnant women with known thoracic aortic dilatation or a familial or genetic predisposition for aortic dissection, strict blood pressure control specifically to prevent Stage II hypertension, is recommended. (Level of Evidence: C)  3. For all pregnant women with known aortic root or ascending aortic dilatation, monthly or bimonthly echocardiographic measurements of the ascending aortic dimensions are recommended to detect aortic expansion until birth. (Level of Evidence: C)  4. For imaging of pregnant women with aortic arch, descending, or abdominal aortic dilatation, magnetic resonance imaging (without gadolinium) is recommended over computed tomographic imaging to avoid exposing both the mother and fetus to ionizing radiation. Transesophageal echocardiogram is an option for imaging of the thoracic aorta. (Level of Evidence: C)  5. Pregnant women with aortic aneurysms should be delivered where cardiothoracic surgery is available. (Level of Evidence: C)  Class IIa 1. Fetal delivery via cesarean section is reasonable for patients with significant aortic enlargement, dissection, or severe aortic valve regurgitation.141 (Level of Evidence: C) Class IIb  1. If progressive aortic dilatation and/or advancing aortic valve regurgitation are documented, prophylactic surgery may be considered.142 (Level of Evidence: C)  I Patient is now pregnant [redacted] weeks She has not seen cardiology, Will arrange for her to been seen by cardiology for fu  echo cardiogram and continued surveillance echocardiogram as outlined in  recommendations above I have suggested that she learn to take BP at home and self monitor with assistance of cardiology She has requested to see pulmonary concerning her bronchodilators and asthma, will arrange appointment with Dr Delton Coombes  I will plan to see her back in two months  Delight Ovens MD      [address] [place] [address] Office [phone]   Beeper [phone]  03/25/2013 11:02 AM

## 2013-03-29 ENCOUNTER — Other Ambulatory Visit (HOSPITAL_COMMUNITY): Payer: Self-pay | Admitting: Cardiothoracic Surgery

## 2013-03-29 ENCOUNTER — Ambulatory Visit (HOSPITAL_COMMUNITY): Payer: Managed Care, Other (non HMO) | Attending: Cardiology

## 2013-03-29 DIAGNOSIS — I080 Rheumatic disorders of both mitral and aortic valves: Secondary | ICD-10-CM | POA: Insufficient documentation

## 2013-03-29 DIAGNOSIS — I359 Nonrheumatic aortic valve disorder, unspecified: Secondary | ICD-10-CM

## 2013-03-29 DIAGNOSIS — I079 Rheumatic tricuspid valve disease, unspecified: Secondary | ICD-10-CM | POA: Insufficient documentation

## 2013-03-29 DIAGNOSIS — I379 Nonrheumatic pulmonary valve disorder, unspecified: Secondary | ICD-10-CM | POA: Insufficient documentation

## 2013-03-29 NOTE — Progress Notes (Signed)
Echocardiogram performed.  

## 2013-03-31 ENCOUNTER — Telehealth: Payer: Self-pay | Admitting: General Surgery

## 2013-03-31 NOTE — Telephone Encounter (Signed)
Message copied by Nita Sells on Wed Mar 31, 2013  4:25 PM ------      Message from: Armanda Magic R      Created: Wed Mar 31, 2013 12:26 PM       Please let patient know that heart function is normal with bicuspid AV with mild aortic stenosis and mild leakiness of AV.  There is mild leakiness of the MV.  Please repeat echo in 3 months to follow aorta and AV while pregnant ------

## 2013-03-31 NOTE — Telephone Encounter (Signed)
PT IS AWARE OF RESULTS AND STATED THAT DR. Tyrone Sage SUGGESTED PT HAVE A MONTHLY TO BI-MONTLY ECHO. TO DR. Mayford Knife TO ADVISE

## 2013-04-05 ENCOUNTER — Other Ambulatory Visit: Payer: Self-pay | Admitting: General Surgery

## 2013-04-05 DIAGNOSIS — I359 Nonrheumatic aortic valve disorder, unspecified: Secondary | ICD-10-CM

## 2013-04-08 ENCOUNTER — Ambulatory Visit: Payer: Managed Care, Other (non HMO) | Admitting: Cardiology

## 2013-04-15 ENCOUNTER — Encounter: Payer: Self-pay | Admitting: Cardiology

## 2013-04-19 ENCOUNTER — Ambulatory Visit: Payer: Managed Care, Other (non HMO) | Admitting: Cardiology

## 2013-04-27 ENCOUNTER — Ambulatory Visit (INDEPENDENT_AMBULATORY_CARE_PROVIDER_SITE_OTHER): Payer: Managed Care, Other (non HMO) | Admitting: Cardiology

## 2013-04-27 ENCOUNTER — Encounter: Payer: Self-pay | Admitting: Cardiology

## 2013-04-27 VITALS — BP 103/59 | HR 76 | Ht 65.0 in | Wt 124.0 lb

## 2013-04-27 DIAGNOSIS — I77810 Thoracic aortic ectasia: Secondary | ICD-10-CM

## 2013-04-27 DIAGNOSIS — Q231 Congenital insufficiency of aortic valve: Secondary | ICD-10-CM

## 2013-04-27 DIAGNOSIS — I359 Nonrheumatic aortic valve disorder, unspecified: Secondary | ICD-10-CM

## 2013-04-27 DIAGNOSIS — I350 Nonrheumatic aortic (valve) stenosis: Secondary | ICD-10-CM

## 2013-04-27 NOTE — Patient Instructions (Signed)
Your physician recommends that you continue on your current medications as directed. Please refer to the Current Medication list given to you today.  Your physician has requested that you have an echocardiogram. Echocardiography is a painless test that uses sound waves to create images of your heart. It provides your doctor with information about the size and shape of your heart and how well your heart's chambers and valves are working. This procedure takes approximately one hour. There are no restrictions for this procedure. ( pt needs monthly while pregnant. She has one scheduled for this Friday. Please schedule for 1 month after this ECHO 10/31)  Your physician recommends that you schedule a follow-up appointment in: 2 Months with Dr. Mayford Knife

## 2013-04-27 NOTE — Progress Notes (Signed)
  [address] [place], [place]  [address] Phone: [phone] Fax:  [phone]  Date:  04/27/2013   ID:  Leslie Sutton, DOB 08-Sep-1985, MRN [id number]  PCP:  Darrow Bussing, MD  Cardiologist:  Armanda Magic, MD     History of Present Illness: Leslie Sutton is a 27 y.o. female with a history of dysplastic bicuspid AV with mild AS/AI and mildly dilated aortic root who is also followed by Dr. Tyrone Sage.  After extensive counseling by both myself and Dr. Tyrone Sage about the risks of pregnancy she is now months pregnant.  Dr. Tyrone Sage has recommended repeating an echo every 1-2 months.  She is doing well.  She denies any chest pain, SOB, DOE, LE edema, dizziness, palpitations or syncope.   Wt Readings from Last 3 Encounters:  04/27/13 124 lb (56.246 kg)  03/25/13 129 lb (58.514 kg)  03/23/13 130 lb (58.968 kg)     Past Medical History  Diagnosis Date  . Aortic stenosis 03/2013    bicuspid AV with mild AS/AI  . Asthma   . Anxiety   . Depression   . Aortic aneurysm     Current Outpatient Prescriptions  Medication Sig Dispense Refill  . Prenatal Vit-Fe Fumarate-FA (PRENATAL MULTIVITAMIN) TABS tablet Take 1 tablet by mouth daily at 12 noon.       No current facility-administered medications for this visit.    Allergies:    Allergies  Allergen Reactions  . Ceclor [Cefaclor] Hives  . Septra [Bactrim] Hives    Social History:  The patient  reports that she has never smoked. She has never used smokeless tobacco. She reports that she drinks alcohol. She reports that she does not use illicit drugs.   Family History:  The patient's family history includes Scleroderma in her sister.   ROS:  Please see the history of present illness.      All other systems reviewed and negative.   PHYSICAL EXAM: VS:  Ht 5\' 5"  (1.651 m)  Wt 124 lb (56.246 kg)  BMI 20.63 kg/m2  LMP 07/07/2012 Well nourished, well developed, in no acute distress HEENT: normal Neck: no JVD Cardiac:   normal S1, S2; RRR; no murmur Lungs:  clear to auscultation bilaterally, no wheezing, rhonchi or rales Abd: soft, nontender, no hepatomegaly Ext: no edema Skin: warm and dry Neuro:  CNs 2-12 intact, no focal abnormalities noted       ASSESSMENT AND PLAN:  1. Dysplastic bicuspid AV with mild AS/AI - very stable with no symptoms.  - recheck 2D echo Friday and will schedule monthly until delivery 2. Mildly dilated aortic root  Followup with me in 2 months  Signed, Armanda Magic, MD 04/27/2013 3:47 PM

## 2013-04-30 ENCOUNTER — Encounter (INDEPENDENT_AMBULATORY_CARE_PROVIDER_SITE_OTHER): Payer: Self-pay

## 2013-04-30 ENCOUNTER — Ambulatory Visit (HOSPITAL_COMMUNITY): Payer: Managed Care, Other (non HMO) | Attending: Internal Medicine | Admitting: Cardiology

## 2013-04-30 DIAGNOSIS — I379 Nonrheumatic pulmonary valve disorder, unspecified: Secondary | ICD-10-CM | POA: Insufficient documentation

## 2013-04-30 DIAGNOSIS — I350 Nonrheumatic aortic (valve) stenosis: Secondary | ICD-10-CM

## 2013-04-30 DIAGNOSIS — I059 Rheumatic mitral valve disease, unspecified: Secondary | ICD-10-CM | POA: Insufficient documentation

## 2013-04-30 DIAGNOSIS — I77810 Thoracic aortic ectasia: Secondary | ICD-10-CM

## 2013-04-30 DIAGNOSIS — I77819 Aortic ectasia, unspecified site: Secondary | ICD-10-CM | POA: Insufficient documentation

## 2013-04-30 DIAGNOSIS — I359 Nonrheumatic aortic valve disorder, unspecified: Secondary | ICD-10-CM

## 2013-04-30 DIAGNOSIS — Q231 Congenital insufficiency of aortic valve: Secondary | ICD-10-CM | POA: Insufficient documentation

## 2013-04-30 NOTE — Progress Notes (Signed)
Echo performed. 

## 2013-05-04 ENCOUNTER — Institutional Professional Consult (permissible substitution): Payer: Managed Care, Other (non HMO) | Admitting: Emergency Medicine

## 2013-05-08 ENCOUNTER — Inpatient Hospital Stay (HOSPITAL_COMMUNITY)
Admission: AD | Admit: 2013-05-08 | Discharge: 2013-05-08 | Disposition: A | Discharge status: Home / Self Care | Payer: Managed Care, Other (non HMO) | Source: Ambulatory Visit | Attending: Obstetrics and Gynecology | Admitting: Obstetrics and Gynecology

## 2013-05-08 ENCOUNTER — Encounter (HOSPITAL_COMMUNITY): Payer: Self-pay

## 2013-05-08 DIAGNOSIS — O9989 Other specified diseases and conditions complicating pregnancy, childbirth and the puerperium: Secondary | ICD-10-CM

## 2013-05-08 DIAGNOSIS — O99891 Other specified diseases and conditions complicating pregnancy: Secondary | ICD-10-CM | POA: Insufficient documentation

## 2013-05-08 DIAGNOSIS — O210 Mild hyperemesis gravidarum: Secondary | ICD-10-CM | POA: Insufficient documentation

## 2013-05-08 DIAGNOSIS — O26892 Other specified pregnancy related conditions, second trimester: Secondary | ICD-10-CM

## 2013-05-08 DIAGNOSIS — R12 Heartburn: Secondary | ICD-10-CM | POA: Insufficient documentation

## 2013-05-08 HISTORY — DX: Benign neoplasm, unspecified site: D36.9

## 2013-05-08 LAB — URINALYSIS, ROUTINE W REFLEX MICROSCOPIC
Bilirubin Urine: NEGATIVE
Glucose, UA: NEGATIVE mg/dL
Hgb urine dipstick: NEGATIVE
Specific Gravity, Urine: <1.005 — ABNORMAL LOW (ref 1.005–1.030)
pH: 6.5 (ref 5.0–8.0)

## 2013-05-08 MED ORDER — RABEPRAZOLE SODIUM 20 MG PO TBEC: 20.0000 mg | DELAYED_RELEASE_TABLET | Freq: Every day | ORAL | Status: AC

## 2013-05-08 NOTE — MAU Provider Note (Signed)
History     CSN: [id number]  Arrival date and time: 05/08/13 4098   First Provider Initiated Contact with Patient 05/08/13 1012      Chief Complaint  Patient presents with  . Nausea  . Emesis   HPI Leslie Sutton 27 y.o. [redacted]w[redacted]d  Came to MAU today after vomiting in the shower.  Noticed that most of the vomitus was clear but there were some black specks in the vomitus.  Was worried thinking this may be "coffee grounds vomitus".  Has been having lots of heartburn and pain behind the sternal notch.  Has been eating tums multiple times daily.  Has temporary relief but then the heartburn returns.  Has altered her diet but has not noticed a significant difference.  Plans to have a schedule C/S at term due to atortic stenosis at birth and now in pregnancy has aortic dilation.  OB History   Grav Para Term Preterm Abortions TAB SAB Ect Mult Living   1               Past Medical History  Diagnosis Date  . Aortic stenosis 03/2013    bicuspid AV with mild AS/AI  . Asthma   . Anxiety   . Depression   . Aortic aneurysm   . Dermoid cyst     Removed    Past Surgical History  Procedure Laterality Date  . Wisdom tooth extraction    . Laparotomy  12/09/2011    Procedure: EXPLORATORY LAPAROTOMY;  Surgeon: Meriel Pica, MD;  Location: [facility];  Service: Gynecology;  Laterality: N/A;  . Salpingoophorectomy  12/09/2011    Procedure: SALPINGO OOPHERECTOMY;  Surgeon: Meriel Pica, MD;  Location: [facility];  Service: Gynecology;  Laterality: Right;    Family History  Problem Relation Age of Onset  . Scleroderma Sister     History  Substance Use Topics  . Smoking status: Never Smoker   . Smokeless tobacco: Never Used  . Alcohol Use: No    Allergies:  Allergies  Allergen Reactions  . Ceclor [Cefaclor] Hives  . Septra [Bactrim] Hives    Prescriptions prior to admission  Medication Sig Dispense Refill  . albuterol (PROVENTIL HFA;VENTOLIN HFA) 108 (90 BASE) MCG/ACT inhaler  Inhale 1 puff into the lungs every 4 (four) hours as needed for wheezing or shortness of breath.      . calcium carbonate (TUMS - DOSED IN MG ELEMENTAL CALCIUM) 500 MG chewable tablet Chew 2 tablets by mouth at bedtime as needed for indigestion or heartburn.      . Fluticasone-Salmeterol (ADVAIR) 250-50 MCG/DOSE AEPB Inhale 1 puff into the lungs 2 (two) times daily.      Marland Kitchen loratadine (CLARITIN) 10 MG tablet Take 10 mg by mouth daily.      . Prenatal Vit-Fe Fumarate-FA (PRENATAL MULTIVITAMIN) TABS tablet Take 1 tablet by mouth daily at 12 noon.        Review of Systems  Constitutional: Negative for fever.  Cardiovascular: Negative for chest pain.  Gastrointestinal: Positive for heartburn, nausea and vomiting. Negative for abdominal pain, diarrhea and constipation.       Had clear vomitus with dark grains in it.  Genitourinary:       No vaginal discharge. No vaginal bleeding. No dysuria.   Physical Exam   Blood pressure 125/87, pulse 103, temperature 98.3 F (36.8 C), resp. rate 16, height 5\' 5"  (1.651 m), weight 138 lb (62.596 kg), last menstrual period 07/07/2012.  Physical Exam  Nursing note  and vitals reviewed. Constitutional: She is oriented to person, place, and time. She appears well-developed and well-nourished. No distress.  HENT:  Head: Normocephalic.  Eyes: EOM are normal.  Neck: Neck supple.  Cardiovascular: Normal rate and regular rhythm.   Respiratory: Effort normal.  Musculoskeletal: Normal range of motion.  Neurological: She is alert and oriented to person, place, and time.  Skin: Skin is warm and dry.  Psychiatric: She has a normal mood and affect.    MAU Course  Procedures Results for orders placed during the hospital encounter of 05/08/13 (from the past 24 hour(s))  URINALYSIS, ROUTINE W REFLEX MICROSCOPIC     Status: Abnormal   Collection Time    05/08/13  9:30 AM      Result Value Range   Color, Urine YELLOW  YELLOW   APPearance CLEAR  CLEAR   Specific  Gravity, Urine <1.005 (*) 1.005 - 1.030   pH 6.5  5.0 - 8.0   Glucose, UA NEGATIVE  NEGATIVE mg/dL   Hgb urine dipstick NEGATIVE  NEGATIVE   Bilirubin Urine NEGATIVE  NEGATIVE   Ketones, ur NEGATIVE  NEGATIVE mg/dL   Protein, ur NEGATIVE  NEGATIVE mg/dL   Urobilinogen, UA 0.2  0.0 - 1.0 mg/dL   Nitrite NEGATIVE  NEGATIVE   Leukocytes, UA NEGATIVE  NEGATIVE    MDM Discussed coffee grounds vomitus with client.  Advised at this point that this was not typical coffee grounds vomitus.  With the daily heartburn not relieved by tums, the next step would be to try some medication for heartburn.  Her insurance does not cover Zantac 150 mg BID and out of pocket costs would be $312. Per month.  Prescribed Aciphen 20 mg po q day which was covered by her insurance.  Advised to seek additional care in the office if this medication does not help her improve in 3-4 days.  OK to continue to use Tums.  Assessment and Plan  Heartburn in pregnancy  Plan Rx Aciphex 20 mg PO q day (#30) no refills Can continue to take tums. If has vomiting with more dark grains or is brown or black, be seen in the office.   BURLESON,TERRI 05/08/2013, 10:36 AM

## 2013-05-08 NOTE — MAU Note (Signed)
Pt presents with complaints of nausea and vomiting this morning and states it was coffee ground in color with specks of brown. Denies any abdominal cramping or vaginal bleeding.

## 2013-05-08 NOTE — MAU Note (Signed)
Pt states awoke this am with acid reflux/indigestion. Took tums. In shower pt vomited and saw spit with dark specks that looked like coffee grounds. Denies pain, however throat burns. Has had heartburn/acid reflux whole pregnancy. Denies hx of hyperemesis. Keeping fluids down at present.

## 2013-05-31 ENCOUNTER — Other Ambulatory Visit (HOSPITAL_COMMUNITY): Payer: Managed Care, Other (non HMO)

## 2013-06-03 ENCOUNTER — Ambulatory Visit: Payer: Managed Care, Other (non HMO) | Admitting: Cardiothoracic Surgery

## 2013-06-08 ENCOUNTER — Ambulatory Visit (INDEPENDENT_AMBULATORY_CARE_PROVIDER_SITE_OTHER): Payer: Managed Care, Other (non HMO) | Admitting: Emergency Medicine

## 2013-06-08 ENCOUNTER — Encounter: Payer: Self-pay | Admitting: Emergency Medicine

## 2013-06-08 VITALS — BP 128/82 | HR 88 | Ht 65.0 in | Wt 145.5 lb

## 2013-06-08 DIAGNOSIS — Q231 Congenital insufficiency of aortic valve: Secondary | ICD-10-CM

## 2013-06-08 DIAGNOSIS — I77810 Thoracic aortic ectasia: Secondary | ICD-10-CM

## 2013-06-08 DIAGNOSIS — R0609 Other forms of dyspnea: Secondary | ICD-10-CM

## 2013-06-08 DIAGNOSIS — J309 Allergic rhinitis, unspecified: Secondary | ICD-10-CM | POA: Insufficient documentation

## 2013-06-08 DIAGNOSIS — J45909 Unspecified asthma, uncomplicated: Secondary | ICD-10-CM

## 2013-06-08 MED ORDER — FLUTICASONE PROPIONATE 50 MCG/ACT NA SUSP: 2.0000 | Freq: Two times a day (BID) | NASAL | Status: DC

## 2013-06-08 NOTE — Assessment & Plan Note (Signed)
Following with Dr. Mayford Knife and Tyrone Sage

## 2013-06-08 NOTE — Assessment & Plan Note (Signed)
Suspect that this is multifactorial. Her progressive exertional symptoms are happening despite being on Advair.  Although asthma does sometimes worsened with pregnancy, in this case she is able to distinguish her symptoms as different from her typical asthma flares. Slowly worsening restriction do to her pregnancy as well as some upper airway irritation and nasal congestion are likely contributors. Must also consider peripartum cardiomyopathy or effects of increased blood volume on her bicuspid aortic valve and cardiac output, but she has been followed closely with serial exams and echocardiograms during the pregnancy. There has been no evidence of decreased cardiac output thus far.

## 2013-06-08 NOTE — Progress Notes (Signed)
Subjective:    Patient ID: Leslie Sutton, female    DOB: 06-08-1986, 27 y.o.   MRN: [id number]  HPI 27 yo woman, hx of congenital bicuspid displastic AV and dilated aorta followed by Dr Tyrone Sage. Also carries hx of asthma and allergies dx at age 21. She began to have recurrent asthma sx about 5 yrs ago, was started on Advair 3 yrs ago (has used off and on depending on the season). Her sx include chest tightness, dyspnea, no cough, some wheeze when she has URI. She was treated for an acute exacerbation with prednisone in October 2014 after a URI. She is now [redacted] wk pregnant, is having more dyspnea with exertion, no real chest tightness (as has been present with her asthma in the past).    Review of Systems  Constitutional: Negative for fever and unexpected weight change.  HENT: Positive for congestion. Negative for dental problem, ear pain, nosebleeds, postnasal drip, rhinorrhea, sinus pressure, sneezing, sore throat and trouble swallowing.   Eyes: Positive for itching. Negative for redness.  Respiratory: Positive for chest tightness, shortness of breath and wheezing. Negative for cough.   Cardiovascular: Negative for palpitations and leg swelling.  Gastrointestinal: Negative for nausea and vomiting.  Genitourinary: Negative for dysuria.  Musculoskeletal: Negative for joint swelling.  Skin: Negative for rash.  Neurological: Negative for headaches.  Hematological: Does not bruise/bleed easily.  Psychiatric/Behavioral: Negative for dysphoric mood. The patient is nervous/anxious.        Objective:   Physical Exam Filed Vitals:   06/08/13 1128  BP: 128/82  Pulse: 88  Height: 5\' 5"  (1.651 m)  Weight: 145 lb 8 oz (65.998 kg)  SpO2: 99%   Gen: Pleasant, well-nourished, in no distress,  normal affect  ENT: No lesions,  mouth clear, oropharynx clear, no postnasal drip but she is congested  Neck: No JVD, no TMG, no carotid bruits  Lungs: No use of accessory muscles, no dullness to  percussion, clear without rales or rhonchi  Cardiovascular: RRR, 3/6 systolic M with intact S1 and S2, no peripheral edema  Abdomen: soft and NT, 21 week uterus  Musculoskeletal: No deformities, no cyanosis or clubbing  Neuro: alert, non focal  Skin: Warm, no lesions or rashes   Past Medical History  Diagnosis Date  . Aortic stenosis 03/2013    bicuspid AV with mild AS/AI  . Asthma   . Anxiety   . Depression   . Aortic aneurysm   . Dermoid cyst     Removed     Family History  Problem Relation Age of Onset  . Scleroderma Sister      History   Social History  . Marital Status: Married    Spouse Name: N/A    Number of Children: N/A  . Years of Education: N/A   Occupational History  . interior designer     [employer]   Social History Main Topics  . Smoking status: Never Smoker   . Smokeless tobacco: Never Used  . Alcohol Use: No  . Drug Use: No  . Sexual Activity: Yes    Birth Control/ Protection: None   Other Topics Concern  . Not on file   Social History Narrative  . 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 Ceclor [Cefaclor] Hives  . Septra [Bactrim] Hives     Outpatient Prescriptions Prior to Visit  Medication Sig Dispense Refill  .  albuterol (PROVENTIL HFA;VENTOLIN HFA) 108 (90 BASE) MCG/ACT inhaler Inhale 1 puff into the lungs every 4 (four) hours as needed for wheezing or shortness of breath.      . calcium carbonate (TUMS - DOSED IN MG ELEMENTAL CALCIUM) 500 MG chewable tablet Chew 2 tablets by mouth at bedtime as needed for indigestion or heartburn.      . Fluticasone-Salmeterol (ADVAIR) 250-50 MCG/DOSE AEPB Inhale 1 puff into the lungs 2 (two) times daily.      Marland Kitchen loratadine (CLARITIN) 10 MG tablet Take 10 mg by mouth daily.      . Prenatal Vit-Fe Fumarate-FA (PRENATAL MULTIVITAMIN) TABS tablet Take 1 tablet by mouth daily at 12 noon.       . RABEprazole (ACIPHEX) 20 MG tablet Take 1 tablet (20 mg total) by mouth daily.  30 tablet  0   No facility-administered medications prior to visit.         Assessment & Plan:  Asthma in adult Clinical history sounds like she has had true bronchospasm and asthma over the last 5 years with some response to Advair. She has not had PFT. Current exacerbating factors could include rhinitis and GERD, both of which have been worse as her pregnancy has progressed. She has been able to distinguish her asthmatic symptoms by the presence of chest tightness.  - continue Advair for now. It is unclear whether her current dyspnea relates to her asthma since it has been for the most part unresponsive to either Advair or albuterol - We'll perform full PFT after her pregnancy to define her baseline lung function  Dyspnea on exertion Suspect that this is multifactorial. Her progressive exertional symptoms are happening despite being on Advair.  Although asthma does sometimes worsened with pregnancy, in this case she is able to distinguish her symptoms as different from her typical asthma flares. Slowly worsening restriction do to her pregnancy as well as some upper airway irritation and nasal congestion are likely contributors. Must also consider peripartum cardiomyopathy or effects of increased blood volume on her bicuspid aortic valve and cardiac output, but she has been followed closely with serial exams and echocardiograms during the pregnancy. There has been no evidence of decreased cardiac output thus far.   Allergic rhinitis May be allergic in nature but suspect that she is more congested there to pregnancy.  - Will try starting nasal saline washes and a nasal steroid to see if these are beneficial - Continue loratadine  Bicuspid aortic valve Following with Dr. Mayford Knife and Tyrone Sage  Aortic root dilation Following with Dr. Tyrone Sage. We'll likely need aortic repair at some point in the future.

## 2013-06-08 NOTE — Assessment & Plan Note (Signed)
May be allergic in nature but suspect that she is more congested there to pregnancy.  - Will try starting nasal saline washes and a nasal steroid to see if these are beneficial - Continue loratadine

## 2013-06-08 NOTE — Patient Instructions (Signed)
Please continue your Advair twice a day Use albuterol as needed Try using nasal saline washes once a day to see if they help with nasal congestion Start fluticasone nasal spray, 2 sprays each nostril twice a day. Try this for 10 days to see if effective with congestion We will perform full breathing tests after your pregnancy Follow with Dr Delton Coombes in 2 months or sooner if you have any problems.

## 2013-06-08 NOTE — Assessment & Plan Note (Signed)
Following with Dr. Tyrone Sage. We'll likely need aortic repair at some point in the future.

## 2013-06-08 NOTE — Assessment & Plan Note (Signed)
Clinical history sounds like she has had true bronchospasm and asthma over the last 5 years with some response to Advair. She has not had PFT. Current exacerbating factors could include rhinitis and GERD, both of which have been worse as her pregnancy has progressed. She has been able to distinguish her asthmatic symptoms by the presence of chest tightness.  - continue Advair for now. It is unclear whether her current dyspnea relates to her asthma since it has been for the most part unresponsive to either Advair or albuterol - We'll perform full PFT after her pregnancy to define her baseline lung function

## 2013-06-09 ENCOUNTER — Ambulatory Visit (HOSPITAL_COMMUNITY): Payer: Managed Care, Other (non HMO) | Attending: Cardiovascular Disease | Admitting: Cardiology

## 2013-06-09 ENCOUNTER — Other Ambulatory Visit (HOSPITAL_COMMUNITY): Payer: Managed Care, Other (non HMO)

## 2013-06-09 ENCOUNTER — Encounter: Payer: Self-pay | Admitting: Cardiovascular Disease

## 2013-06-09 DIAGNOSIS — Q231 Congenital insufficiency of aortic valve: Secondary | ICD-10-CM | POA: Insufficient documentation

## 2013-06-09 DIAGNOSIS — I77810 Thoracic aortic ectasia: Secondary | ICD-10-CM

## 2013-06-09 DIAGNOSIS — I359 Nonrheumatic aortic valve disorder, unspecified: Secondary | ICD-10-CM | POA: Insufficient documentation

## 2013-06-09 DIAGNOSIS — I350 Nonrheumatic aortic (valve) stenosis: Secondary | ICD-10-CM

## 2013-06-09 NOTE — Progress Notes (Signed)
Limited echo performed. 

## 2013-06-14 ENCOUNTER — Telehealth: Payer: Self-pay | Admitting: Cardiology

## 2013-06-14 ENCOUNTER — Telehealth: Payer: Self-pay | Admitting: General Surgery

## 2013-06-14 DIAGNOSIS — I77810 Thoracic aortic ectasia: Secondary | ICD-10-CM

## 2013-06-14 NOTE — Telephone Encounter (Signed)
Ordered for pt and sent to Lorne Skeens to set up.

## 2013-06-14 NOTE — Telephone Encounter (Signed)
Message copied by Nita Sells on Mon Jun 14, 2013 10:41 AM ------      Message from: Armanda Magic R      Created: Fri Jun 11, 2013  5:11 PM       Patient notified that her ascending aorta has increased in size from 4.1 to 4.4cm in 6 weeks.  I discussed this with Dr. Tyrone Sage.  Given her current pregnancy he would like her to undergo MRI to verify measurement since we have an MRI prior to pregnancy.  I have discussed this with her Maternal Fetal MD at [facility].  Please order, per Dr. Eden Emms, a cardiac MRI without contrast to assess aorta with IIR axial, candy cane Fiesta and contact Dr. Eden Emms prior to initiating study. ------

## 2013-06-14 NOTE — Telephone Encounter (Signed)
Follow up      Pt called following up and test that needed to be scheduled.    Pt said test needed to be done this week?  Pt would like a call back on the status of this.

## 2013-06-15 ENCOUNTER — Ambulatory Visit: Payer: Managed Care, Other (non HMO) | Admitting: Cardiothoracic Surgery

## 2013-06-15 NOTE — Telephone Encounter (Signed)
Following up       Pt's hus called to follow up on pt's MRI, Pt is pregnant and other Dr offices a canceling on her and needs to know something soon.  On 12/12 pt was told she would call back with an apptointment but never got a call back.

## 2013-06-16 NOTE — Telephone Encounter (Signed)
Pt spoke with Judithe Modest who is scheduling this procedure for pt. They are working on scheduling pt this week and pt is aware.

## 2013-06-17 ENCOUNTER — Telehealth: Payer: Self-pay | Admitting: General Surgery

## 2013-06-17 ENCOUNTER — Ambulatory Visit (HOSPITAL_COMMUNITY)
Admission: RE | Admit: 2013-06-17 | Discharge: 2013-06-17 | Disposition: A | Discharge status: Home / Self Care | Payer: Managed Care, Other (non HMO) | Source: Ambulatory Visit | Attending: Cardiology | Admitting: Cardiology

## 2013-06-17 DIAGNOSIS — Q231 Congenital insufficiency of aortic valve: Secondary | ICD-10-CM | POA: Insufficient documentation

## 2013-06-17 DIAGNOSIS — I77810 Thoracic aortic ectasia: Secondary | ICD-10-CM | POA: Insufficient documentation

## 2013-06-17 NOTE — Telephone Encounter (Signed)
Message copied by Nita Sells on Thu Jun 17, 2013  5:39 PM ------      Message from: Armanda Magic R      Created: Thu Jun 17, 2013  4:10 PM       Please order a 2D echo in 1 month for followup of aortic root dilatation ------

## 2013-06-17 NOTE — Telephone Encounter (Signed)
Pt is aware and will see Dr Mayford Knife on Tuesday and will schedule her month f/u ECHO when she leaves that appt.

## 2013-06-22 ENCOUNTER — Encounter: Payer: Self-pay | Admitting: Cardiology

## 2013-06-22 ENCOUNTER — Ambulatory Visit (INDEPENDENT_AMBULATORY_CARE_PROVIDER_SITE_OTHER): Payer: Managed Care, Other (non HMO) | Admitting: Cardiology

## 2013-06-22 ENCOUNTER — Encounter (INDEPENDENT_AMBULATORY_CARE_PROVIDER_SITE_OTHER): Payer: Managed Care, Other (non HMO)

## 2013-06-22 ENCOUNTER — Encounter: Payer: Self-pay | Admitting: *Deleted

## 2013-06-22 VITALS — BP 126/82 | HR 112 | Ht 65.0 in | Wt 148.0 lb

## 2013-06-22 DIAGNOSIS — I498 Other specified cardiac arrhythmias: Secondary | ICD-10-CM

## 2013-06-22 DIAGNOSIS — R000 Tachycardia, unspecified: Secondary | ICD-10-CM

## 2013-06-22 DIAGNOSIS — I77810 Thoracic aortic ectasia: Secondary | ICD-10-CM

## 2013-06-22 DIAGNOSIS — Q231 Congenital insufficiency of aortic valve: Secondary | ICD-10-CM

## 2013-06-22 DIAGNOSIS — Q2381 Bicuspid aortic valve: Secondary | ICD-10-CM

## 2013-06-22 DIAGNOSIS — I350 Nonrheumatic aortic (valve) stenosis: Secondary | ICD-10-CM

## 2013-06-22 DIAGNOSIS — I359 Nonrheumatic aortic valve disorder, unspecified: Secondary | ICD-10-CM

## 2013-06-22 NOTE — Progress Notes (Signed)
Patient ID: Leslie Sutton, female   DOB: Mar 01, 1986, 27 y.o.   MRN: [id number] E-Cardio 24 hour holter monitor applied to patient.

## 2013-06-22 NOTE — Patient Instructions (Signed)
Your physician recommends that you continue on your current medications as directed. Please refer to the Current Medication list given to you today.  Your physician has recommended that you wear a holter monitor. Holter monitors are medical devices that record the heart's electrical activity. Doctors most often use these monitors to diagnose arrhythmias. Arrhythmias are problems with the speed or rhythm of the heartbeat. The monitor is a small, portable device. You can wear one while you do your normal daily activities. This is usually used to diagnose what is causing palpitations/syncope (passing out).  Your physician has requested that you have an echocardiogram. Echocardiography is a painless test that uses sound waves to create images of your heart. It provides your doctor with information about the size and shape of your heart and how well your heart's chambers and valves are working. This procedure takes approximately one hour. There are no restrictions for this procedure. (please schedule one month from now)  Your physician recommends that you schedule a follow-up appointment in: 2 Months with Dr Mayford Knife

## 2013-06-22 NOTE — Progress Notes (Signed)
[address] [place], [place]  [address] Phone: [phone] Fax:  [phone]  Date:  06/22/2013   ID:  Leslie Sutton, DOB [birth year], MRN [id number]  PCP:  Willow Ora, MD  Cardiologist:  Armanda Magic, MD     History of Present Illness: Leslie Sutton is a 27 y.o. female with a history of dysplastic bicuspid AV with mild AS/AI and mildly dilated aortic root who is also followed by Dr. Tyrone Sage. After extensive counseling by both myself and Dr. Tyrone Sage about the risks of pregnancy she is now months pregnant. Dr. Tyrone Sage has recommended repeating an echo every 1-2 months. She is doing well. She denies any chest pain, SOB, DOE, LE edema, dizziness, palpitations or syncope.  Her most recent echo showed an increase in her ascending aorta from 4.1 to 4.4cm.  A cardiac MRI showed a stable ascending aortic aneurysm at 4.1cm/  She is now [redacted] weeks pregnant.      Wt Readings from Last 3 Encounters:  06/22/13 148 lb (67.132 kg)  06/08/13 145 lb 8 oz (65.998 kg)  05/08/13 138 lb (62.596 kg)     Past Medical History  Diagnosis Date  . Aortic stenosis 03/2013    bicuspid AV with mild AS/AI  . Asthma   . Anxiety   . Depression   . Aortic aneurysm   . Dermoid cyst     Removed    Current Outpatient Prescriptions  Medication Sig Dispense Refill  . albuterol (PROVENTIL HFA;VENTOLIN HFA) 108 (90 BASE) MCG/ACT inhaler Inhale 1 puff into the lungs every 4 (four) hours as needed for wheezing or shortness of breath.      Marland Kitchen albuterol (PROVENTIL) (2.5 MG/3ML) 0.083% nebulizer solution Take 2.5 mg by nebulization every 6 (six) hours as needed for wheezing or shortness of breath.      . calcium carbonate (TUMS - DOSED IN MG ELEMENTAL CALCIUM) 500 MG chewable tablet Chew 2 tablets by mouth at bedtime as needed for indigestion or heartburn.      . fluticasone (FLONASE) 50 MCG/ACT nasal spray Place 2 sprays into both nostrils 2 (two) times daily.  8.5 g  2  . Fluticasone-Salmeterol  (ADVAIR) 250-50 MCG/DOSE AEPB Inhale 1 puff into the lungs 2 (two) times daily.      Marland Kitchen loratadine (CLARITIN) 10 MG tablet Take 10 mg by mouth daily.      . Prenatal Vit-Fe Fumarate-FA (PRENATAL MULTIVITAMIN) TABS tablet Take 1 tablet by mouth daily at 12 noon.      . RABEprazole (ACIPHEX) 20 MG tablet Take 1 tablet (20 mg total) by mouth daily.  30 tablet  0   No current facility-administered medications for this visit.    Allergies:    Allergies  Allergen Reactions  . Ceclor [Cefaclor] Hives  . Septra [Bactrim] Hives    Social History:  The patient  reports that she has never smoked. She has never used smokeless tobacco. She reports that she does not drink alcohol or use illicit drugs.   Family History:  The patient's family history includes Scleroderma in her sister.   ROS:  Please see the history of present illness.      All other systems reviewed and negative.   PHYSICAL EXAM: VS:  BP 126/82  Pulse 112  Ht 5\' 5"  (1.651 m)  Wt 148 lb (67.132 kg)  BMI 24.63 kg/m2  LMP 07/07/2012 Well nourished, well developed, in no acute distress HEENT: normal Neck: no JVD Cardiac:  normal S1,  S2; RRR; no murmur, tachy Lungs:  clear to auscultation bilaterally, no wheezing, rhonchi or rales Abd: soft, nontender, no hepatomegaly Ext: no edema Skin: warm and dry Neuro:  CNs 2-12 intact, no focal abnormalities noted  ASSESSMENT AND PLAN:  1. Dysplastic bicuspid AV with mild AS/AI - very stable with no symptoms. 2. Mildly dilated aortic root - most recent MRI shows stable aortic root at 4.1cm in diameter   - recheck 2D echo monthly until delivery    -  We discussed that she cannot have a vaginal delivery and will need to have a Csection 3.  Sinus tachycardia - 24 holter monitor to assess average HR  Followup with me in 2 months   Signed, Armanda Magic, MD 06/22/2013 9:33 AM

## 2013-06-28 ENCOUNTER — Other Ambulatory Visit (HOSPITAL_COMMUNITY): Payer: Managed Care, Other (non HMO)

## 2013-07-03 ENCOUNTER — Encounter (HOSPITAL_COMMUNITY): Payer: Self-pay | Admitting: *Deleted

## 2013-07-03 ENCOUNTER — Inpatient Hospital Stay (HOSPITAL_COMMUNITY)
Admission: AD | Admit: 2013-07-03 | Discharge: 2013-07-04 | Disposition: A | Discharge status: Home / Self Care | Payer: Managed Care, Other (non HMO) | Source: Ambulatory Visit | Attending: Obstetrics & Gynecology | Admitting: Obstetrics & Gynecology

## 2013-07-03 DIAGNOSIS — W010XXA Fall on same level from slipping, tripping and stumbling without subsequent striking against object, initial encounter: Secondary | ICD-10-CM | POA: Insufficient documentation

## 2013-07-03 DIAGNOSIS — W19XXXA Unspecified fall, initial encounter: Secondary | ICD-10-CM

## 2013-07-03 DIAGNOSIS — M25473 Effusion, unspecified ankle: Secondary | ICD-10-CM | POA: Insufficient documentation

## 2013-07-03 DIAGNOSIS — O9989 Other specified diseases and conditions complicating pregnancy, childbirth and the puerperium: Principal | ICD-10-CM

## 2013-07-03 DIAGNOSIS — R109 Unspecified abdominal pain: Secondary | ICD-10-CM | POA: Insufficient documentation

## 2013-07-03 DIAGNOSIS — M25476 Effusion, unspecified foot: Secondary | ICD-10-CM | POA: Insufficient documentation

## 2013-07-03 DIAGNOSIS — M25579 Pain in unspecified ankle and joints of unspecified foot: Secondary | ICD-10-CM | POA: Insufficient documentation

## 2013-07-03 DIAGNOSIS — Y92009 Unspecified place in unspecified non-institutional (private) residence as the place of occurrence of the external cause: Secondary | ICD-10-CM | POA: Insufficient documentation

## 2013-07-03 DIAGNOSIS — O99891 Other specified diseases and conditions complicating pregnancy: Secondary | ICD-10-CM | POA: Insufficient documentation

## 2013-07-03 LAB — FIBRINOGEN: Fibrinogen: 399 mg/dL (ref 204–475)

## 2013-07-03 NOTE — MAU Note (Signed)
Ice pack applied to right foot.

## 2013-07-03 NOTE — MAU Note (Signed)
Patient fell outside in her driveway around 7pm. Twisted her right ankle and fell on the right side and rolled to her back. Did not fall on her abdomen but has had mild cramping for the last hour and no fetal movement since the fall.  Denies vaginal bleeding.

## 2013-07-03 NOTE — Discharge Instructions (Signed)

## 2013-07-03 NOTE — MAU Provider Note (Signed)
History     CSN: [id number]  Arrival date and time: 07/03/13 2102   First Provider Initiated Contact with Patient 07/03/13 2137      Chief Complaint  Patient presents with  . Fall   HPI Ms Leslie Sutton is a 28yo G1 @ 24.3wks who presents for eval s/p fall onto right side after tripping over a dog. This was a witnessed event by her husband who states she landed on her right side, ankle and wrist and that she did not strike her abd in the process. She reports a brief episode of cramping immediately after which has not reoccurred. Denies leaking or bldg. Describes pain/swelling in right ankle- came in with ice applied.  OB History   Grav Para Term Preterm Abortions TAB SAB Ect Mult Living   1               Past Medical History  Diagnosis Date  . Aortic stenosis 03/2013    bicuspid AV with mild AS/AI  . Asthma   . Anxiety   . Depression   . Aortic aneurysm   . Dermoid cyst     Removed    Past Surgical History  Procedure Laterality Date  . Wisdom tooth extraction    . Laparotomy  12/09/2011    Procedure: EXPLORATORY LAPAROTOMY;  Surgeon: Margarette Asal, MD;  Location: [facility];  Service: Gynecology;  Laterality: N/A;  . Salpingoophorectomy  12/09/2011    Procedure: SALPINGO OOPHERECTOMY;  Surgeon: Margarette Asal, MD;  Location: [facility];  Service: Gynecology;  Laterality: Right;    Family History  Problem Relation Age of Onset  . Scleroderma Sister     History  Substance Use Topics  . Smoking status: Never Smoker   . Smokeless tobacco: Never Used  . Alcohol Use: No    Allergies:  Allergies  Allergen Reactions  . Ceclor [Cefaclor] Hives  . Septra [Bactrim] Hives    Prescriptions prior to admission  Medication Sig Dispense Refill  . acetaminophen (TYLENOL) 500 MG tablet Take 1,000 mg by mouth every 6 (six) hours as needed (foot pain).      Marland Kitchen albuterol (PROVENTIL HFA;VENTOLIN HFA) 108 (90 BASE) MCG/ACT inhaler Inhale 1 puff into the lungs every 4 (four) hours as needed  for wheezing or shortness of breath.      . fluticasone (FLONASE) 50 MCG/ACT nasal spray Place 2 sprays into both nostrils 2 (two) times daily.  8.5 g  2  . Fluticasone-Salmeterol (ADVAIR) 250-50 MCG/DOSE AEPB Inhale 1 puff into the lungs 2 (two) times daily.      Marland Kitchen loratadine (CLARITIN) 10 MG tablet Take 10 mg by mouth daily.      . Prenatal Vit-Fe Fumarate-FA (PRENATAL MULTIVITAMIN) TABS tablet Take 1 tablet by mouth daily at 12 noon.      . RABEprazole (ACIPHEX) 20 MG tablet Take 1 tablet (20 mg total) by mouth daily.  30 tablet  0  . albuterol (PROVENTIL) (2.5 MG/3ML) 0.083% nebulizer solution Take 2.5 mg by nebulization every 6 (six) hours as needed for wheezing or shortness of breath.        ROS Physical Exam   Blood pressure 130/81, pulse 78, temperature 98.6 F (37 C), temperature source Oral, resp. rate 18, height 5\' 5"  (1.651 m), weight 68.04 kg (150 lb), last menstrual period 07/07/2012, SpO2 100.00%.  Physical Exam  Constitutional: She is oriented to person, place, and time. She appears well-developed.  HENT:  Head: Normocephalic.  Cardiovascular: Normal rate.  Respiratory: Effort normal.  GI: Soft.  EFM 140s, appropriate for GA, no decels No ctx per toco  Genitourinary: Vagina normal.  Cx C/L  Musculoskeletal:  Right ankle with swelling- ice applied  Neurological: She is alert and oriented to person, place, and time.  Skin: Skin is warm and dry.  Psychiatric: She has a normal mood and affect. Her behavior is normal. Thought content normal.   Fibrinogen: 399 (nl)  MAU Course  Procedures  Rev'd pt with Dr Lynnette Caffey- if fibrinogen normal may d/c home.  Assessment and Plan  IUP at 24.3wks S/p fall onto side  D/C home- comfort tips rev'd May need to have ankle eval by primary MD if discomfort persists F/U with OB at next visit as scheduled  [name] 07/03/2013, 10:54 PM

## 2013-07-05 ENCOUNTER — Telehealth: Payer: Self-pay | Admitting: Cardiology

## 2013-07-05 NOTE — Telephone Encounter (Signed)
Please let patient know that heart monitor showed normal rhythm with occasional extra heart beats from the top of the heart which are benign

## 2013-07-05 NOTE — Telephone Encounter (Signed)
Pt is aware.  

## 2013-07-16 ENCOUNTER — Ambulatory Visit (HOSPITAL_COMMUNITY)
Admission: RE | Admit: 2013-07-16 | Discharge: 2013-07-16 | Disposition: A | Discharge status: Home / Self Care | Payer: Managed Care, Other (non HMO) | Source: Ambulatory Visit | Attending: Obstetrics and Gynecology | Admitting: Obstetrics and Gynecology

## 2013-07-16 ENCOUNTER — Ambulatory Visit (HOSPITAL_BASED_OUTPATIENT_CLINIC_OR_DEPARTMENT_OTHER): Payer: Managed Care, Other (non HMO) | Admitting: Cardiology

## 2013-07-16 DIAGNOSIS — Q231 Congenital insufficiency of aortic valve: Secondary | ICD-10-CM | POA: Insufficient documentation

## 2013-07-16 DIAGNOSIS — O99419 Diseases of the circulatory system complicating pregnancy, unspecified trimester: Principal | ICD-10-CM

## 2013-07-16 DIAGNOSIS — I2510 Atherosclerotic heart disease of native coronary artery without angina pectoris: Secondary | ICD-10-CM | POA: Insufficient documentation

## 2013-07-16 DIAGNOSIS — I77810 Thoracic aortic ectasia: Secondary | ICD-10-CM

## 2013-07-16 NOTE — Consult Note (Signed)
MFM Follow-up Note  Leslie Sutton is a 28 year old G21 Caucasian female at 26+2 weeks who presented for a follow-up consultation to formulate delivery plans. She was seen previously by Dr. George Hugh this past September due to Leslie Sutton's heart condition: functional bicuspid aortic valve with mild AS/AR and a dilated aortic root measuring 4.1 cms with normal LV function. She was advised of the potential complications including progression of dilation, dissection, aneurysm and rupture during pregnancy. She was also told of the increased risk for arrhythmias, myocardial infarction, stroke and heart failure. She was encouraged to continue to follow-up with her cardiologist, Dr. Fransico Him and her CT surgeon, Dr. Lanelle Bal.   Since that consultation, she has had an uncomplicated prenatal course. She has had serial ECHOs and the one this morning showed an increase in the aortic root measurement to 4.4 cms per patient. Previously, this increased measurement was obtained by ECHO but a MR was not able to confirm further dilation of the root diameter. She also had a fetal ECHO recently performed by Dr. Filbert Schilder which was normal.  Today, she was asymptomatic as far as her CV system was concerned but had just fallen and fractured her ankle and sprained her wrist.   The majority of our discussion concentrated on two unresolved issues. First one was mode of delivery. Leslie Sutton had already decided to undergo a primary C/S as recommended by her cardiologist and CT surgeon. I reiterated to her that a C/S was a reasonable choice but not necessarily a better choice than labor with a dense epidural and a passive second stage. With a vaginal delivery, there is less blood loss and postpartum pain. Leslie Sutton and her husband also requested advice on the most optimal location of delivery. They do not want to deliver in [facility] because of not having CT surgery support at [facility].   We reviewed all of the excellent choices of  medical facilities in this area including [facility], [place], [facility] and [place]. I offered to help them with consultations and appointments at any of these locations. They needed time to decide which facility they would use so I gave them my contact information and encouraged them to call with any questions or concerns.   Thank you again for allowing [place] to participate in the care of Leslie Sutton.  (Face-to-face consultation with patient: 30 min)

## 2013-07-16 NOTE — Progress Notes (Signed)
Limited echo performed. 

## 2013-07-17 ENCOUNTER — Inpatient Hospital Stay (HOSPITAL_COMMUNITY)
Admission: AD | Admit: 2013-07-17 | Discharge: 2013-07-17 | Disposition: A | Discharge status: Home / Self Care | Payer: Managed Care, Other (non HMO) | Source: Ambulatory Visit | Attending: Obstetrics and Gynecology | Admitting: Obstetrics and Gynecology

## 2013-07-17 ENCOUNTER — Encounter (HOSPITAL_COMMUNITY): Payer: Self-pay | Admitting: *Deleted

## 2013-07-17 DIAGNOSIS — O99891 Other specified diseases and conditions complicating pregnancy: Secondary | ICD-10-CM | POA: Insufficient documentation

## 2013-07-17 DIAGNOSIS — O26899 Other specified pregnancy related conditions, unspecified trimester: Secondary | ICD-10-CM

## 2013-07-17 DIAGNOSIS — R109 Unspecified abdominal pain: Secondary | ICD-10-CM | POA: Insufficient documentation

## 2013-07-17 DIAGNOSIS — O9989 Other specified diseases and conditions complicating pregnancy, childbirth and the puerperium: Secondary | ICD-10-CM

## 2013-07-17 LAB — URINALYSIS, ROUTINE W REFLEX MICROSCOPIC
Bilirubin Urine: NEGATIVE
Glucose, UA: NEGATIVE mg/dL
Hgb urine dipstick: NEGATIVE
Ketones, ur: NEGATIVE mg/dL
Leukocytes, UA: NEGATIVE
Nitrite: NEGATIVE
PH: 5.5 (ref 5.0–8.0)
Protein, ur: NEGATIVE mg/dL
Urobilinogen, UA: 0.2 mg/dL (ref 0.0–1.0)

## 2013-07-17 NOTE — MAU Note (Addendum)
Driving back from [place] started having crampiness and pain in lower abd. Abd sore to touch but crampiness comes and goes. Pain does radiate to lower back

## 2013-07-17 NOTE — Discharge Instructions (Signed)
Preterm Labor Information Preterm labor is when labor starts at less than 37 weeks of pregnancy. The normal length of a pregnancy is 39 to 41 weeks. CAUSES Often, there is no identifiable underlying cause as to why a woman goes into preterm labor. One of the most common known causes of preterm labor is infection. Infections of the uterus, cervix, vagina, amniotic sac, bladder, kidney, or even the lungs (pneumonia) can cause labor to start. Other suspected causes of preterm labor include:   Urogenital infections, such as yeast infections and bacterial vaginosis.   Uterine abnormalities (uterine shape, uterine septum, fibroids, or bleeding from the placenta).   A cervix that has been operated on (it may fail to stay closed).   Malformations in the fetus.   Multiple gestations (twins, triplets, and so on).   Breakage of the amniotic sac.  RISK FACTORS  Having a previous history of preterm labor.   Having premature rupture of membranes (PROM).   Having a placenta that covers the opening of the cervix (placenta previa).   Having a placenta that separates from the uterus (placental abruption).   Having a cervix that is too weak to hold the fetus in the uterus (incompetent cervix).   Having too much fluid in the amniotic sac (polyhydramnios).   Taking illegal drugs or smoking while pregnant.   Not gaining enough weight while pregnant.   Being younger than 18 and older than 29 years old.   Having a low socioeconomic status.   Being African American. SYMPTOMS Signs and symptoms of preterm labor include:   Menstrual-like cramps, abdominal pain, or back pain.  Uterine contractions that are regular, as frequent as six in an hour, regardless of their intensity (may be mild or painful).  Contractions that start on the top of the uterus and spread down to the lower abdomen and back.   A sense of increased pelvic pressure.   A watery or bloody mucus discharge that  comes from the vagina.  TREATMENT Depending on the length of the pregnancy and other circumstances, your health care provider may suggest bed rest. If necessary, there are medicines that can be given to stop contractions and to mature the fetal lungs. If labor happens before 34 weeks of pregnancy, a prolonged hospital stay may be recommended. Treatment depends on the condition of both you and the fetus.  WHAT SHOULD YOU DO IF YOU THINK YOU ARE IN PRETERM LABOR? Call your health care provider right away. You will need to go to the hospital to get checked immediately. HOW CAN YOU PREVENT PRETERM LABOR IN FUTURE PREGNANCIES? You should:   Stop smoking if you smoke.  Maintain healthy weight gain and avoid chemicals and drugs that are not necessary.  Be watchful for any type of infection.  Inform your health care provider if you have a known history of preterm labor. Document Released: 09/07/2003 Document Revised: 02/17/2013 Document Reviewed: 07/20/2012 [employer] Patient Information 2014 [employer].    

## 2013-07-17 NOTE — MAU Provider Note (Signed)
History     CSN: [id number]  Arrival date and time: 07/17/13 2104   First Provider Initiated Contact with Patient 07/17/13 2138      Chief Complaint  Patient presents with  . Abdominal Cramping   HPI This is a 28 y.o. female at [redacted]w[redacted]d who presents with c/o cramping in lower abdomen since 6pm tonight.  No discernable contractions, belly felt tight all the time.  No leaking or bleeding . + fetal movement.  Plans cesarean delivery at [facility] or [facility] due to cardiac risks (patient)  RN Note: Driving back from [place] started having crampiness and pain in lower abd. Abd sore to touch but crampiness comes and goes. Pain does radiate to lower back   Revision History...      Date/Time User Action    07/17/2013 9:17 PM Kandyce Rud, RN Addend    07/17/2013 9:17 PM Kandyce Rud, RN Sign   View Details Report      OB History   Grav Para Term Preterm Abortions TAB SAB Ect Mult Living   1               Past Medical History  Diagnosis Date  . Aortic stenosis 03/2013    bicuspid AV with mild AS/AI  . Asthma   . Anxiety   . Depression   . Aortic aneurysm   . Dermoid cyst     Removed    Past Surgical History  Procedure Laterality Date  . Wisdom tooth extraction    . Laparotomy  12/09/2011    Procedure: EXPLORATORY LAPAROTOMY;  Surgeon: Margarette Asal, MD;  Location: [facility];  Service: Gynecology;  Laterality: N/A;  . Salpingoophorectomy  12/09/2011    Procedure: SALPINGO OOPHERECTOMY;  Surgeon: Margarette Asal, MD;  Location: [facility];  Service: Gynecology;  Laterality: Right;    Family History  Problem Relation Age of Onset  . Scleroderma Sister     History  Substance Use Topics  . Smoking status: Never Smoker   . Smokeless tobacco: Never Used  . Alcohol Use: No    Allergies:  Allergies  Allergen Reactions  . Ceclor [Cefaclor] Hives  . Septra [Bactrim] Hives    Prescriptions prior to admission  Medication Sig Dispense Refill  . acetaminophen (TYLENOL) 500 MG tablet  Take 1,000 mg by mouth every 6 (six) hours as needed (foot pain).      Marland Kitchen albuterol (PROVENTIL HFA;VENTOLIN HFA) 108 (90 BASE) MCG/ACT inhaler Inhale 1 puff into the lungs every 4 (four) hours as needed for wheezing or shortness of breath.      . Fluticasone-Salmeterol (ADVAIR) 250-50 MCG/DOSE AEPB Inhale 1 puff into the lungs 2 (two) times daily.      Marland Kitchen loratadine (CLARITIN) 10 MG tablet Take 10 mg by mouth daily.      . Prenatal Vit-Fe Fumarate-FA (PRENATAL MULTIVITAMIN) TABS tablet Take 1 tablet by mouth daily at 12 noon.      . RABEprazole (ACIPHEX) 20 MG tablet Take 1 tablet (20 mg total) by mouth daily.  30 tablet  0  . albuterol (PROVENTIL) (2.5 MG/3ML) 0.083% nebulizer solution Take 2.5 mg by nebulization every 6 (six) hours as needed for wheezing or shortness of breath.        Review of Systems  Constitutional: Negative for fever, chills and malaise/fatigue.  Gastrointestinal: Positive for abdominal pain (crampiness, no other pain). Negative for nausea, vomiting, diarrhea and constipation.  Genitourinary: Negative for dysuria.  Neurological: Negative for dizziness.   Physical  Exam   Blood pressure 117/80, pulse 87, temperature 98.1 F (36.7 C), resp. rate 20, height 5\' 5"  (1.651 m), last menstrual period 07/07/2012.  Physical Exam  Constitutional: She is oriented to person, place, and time. She appears well-developed and well-nourished. No distress.  Cardiovascular: Normal rate.   Respiratory: Effort normal.  GI: Soft.  Genitourinary: Vagina normal and uterus normal. No vaginal discharge found.  Dilation: Closed Effacement (%): Thick Station: -3 Exam by:: Hansel Feinstein CNM   Musculoskeletal: Normal range of motion.  Cast on RIght leg (fractured)  Neurological: She is alert and oriented to person, place, and time.  Skin: Skin is warm and dry.  Psychiatric: She has a normal mood and affect.   Fetal heart rate pattern reassuring No contractions seen  MAU Course   Procedures  MDM FFn collected but not sent due to closed cervix and no contractions   Assessment and Plan  A:  SIUP at [redacted]w[redacted]d       Uterine cramping with no discernable contractions      Cervix long and closed  P:  Discussed with Dr Radene Knee      Discharge home      PTL precautions reviewed.  [facility] 07/17/2013, 10:13 PM

## 2013-07-19 ENCOUNTER — Encounter: Payer: Self-pay | Admitting: Cardiothoracic Surgery

## 2013-07-19 ENCOUNTER — Ambulatory Visit (INDEPENDENT_AMBULATORY_CARE_PROVIDER_SITE_OTHER): Payer: Managed Care, Other (non HMO) | Admitting: Cardiothoracic Surgery

## 2013-07-19 VITALS — BP 110/76 | HR 100 | Resp 20 | Ht 65.0 in | Wt 154.0 lb

## 2013-07-19 DIAGNOSIS — I712 Thoracic aortic aneurysm, without rupture, unspecified: Secondary | ICD-10-CM

## 2013-07-19 NOTE — Progress Notes (Signed)
[facility] [id number] Date of Birth: 1985-08-03  Referring: Sueanne Margarita, MD Primary Care: Leamon Arnt, MD Fertility Dr Monica Martinez OB Dr Matthew Saras  Chief Complaint:    Dilated aorta and [redacted] weeks pregnant   History of Present Illness:    Patient is 28 yo female referred  by Dr Radford Pax. 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 She was followed by Pediatric cardiology since birth and never required any surgical  Intervention. She notes over the years she has many echo cardiograms, but we do not have the results of these. A MRI of the chest in 2012  demonstrated a dilated aorta to 3.9 cm. She is asymptomatic, denies chest pain, sob, heart failure symptoms, no syncope or near syncope. She has no family history of cardiac disease, dissections, aortic aneurysms, brain aneurysms or early death for unexplained reasons. One sister has scleroderma.   The patient is now [redacted] weeks pregnant and doing well other than recent fall with a broken right foot and injured right wrist.  She's been monitored a cardiology with serial echocardiograms, and a followup MRI of the chest last month.   Current Activity/ Functional Status:  Patient is independent with mobility/ambulation, transfers, ADL's, IADL's.  Zubrod Score: At the time of surgery this patient's most appropriate activity status/level should be described as: [x]  Normal activity, no symptoms []  Symptoms, fully ambulatory []  Symptoms, in bed less than or equal to 50% of the time []  Symptoms, in bed greater than 50% of the time but less than 100% []  Bedridden []  Moribund   Past Medical History  Diagnosis Date  . Aortic stenosis 03/2013    bicuspid AV with mild AS/AI  . Asthma   . Anxiety   . Depression   . Aortic aneurysm   . Dermoid cyst     Removed    Past Surgical History  Procedure Laterality Date  . Wisdom tooth extraction    . Laparotomy   12/09/2011    Procedure: EXPLORATORY LAPAROTOMY;  Surgeon: Margarette Asal, MD;  Location: [facility];  Service: Gynecology;  Laterality: N/A;  . Salpingoophorectomy  12/09/2011    Procedure: SALPINGO OOPHERECTOMY;  Surgeon: Margarette Asal, MD;  Location: [facility];  Service: Gynecology;  Laterality: Right;    Family History  Problem Relation Age of Onset  . Scleroderma Sister     History   Social History  . Marital Status: Married    Spouse Name: N/A    Number of Children: none  . Years of Education: Maters in Agricultural consultant   Occupational History  . interior designer     [employer]   Social History Main Topics  . Smoking status: Never Smoker   . Smokeless tobacco: Never Used  . Alcohol Use: Yes     Comment: beer- rare  . Drug Use: No  . Sexually Active: Yes    Birth Control/ Protection: None      History  Smoking status  . Never Smoker   Smokeless tobacco  . Never Used    History  Alcohol Use No     Allergies  Allergen Reactions  . Ceclor [Cefaclor] Hives  . Septra [Bactrim] Hives    Current Outpatient Prescriptions  Medication Sig Dispense Refill  . acetaminophen (TYLENOL) 500 MG tablet Take 1,000 mg by mouth every 6 (six)  hours as needed (foot pain).      Marland Kitchen albuterol (PROVENTIL HFA;VENTOLIN HFA) 108 (90 BASE) MCG/ACT inhaler Inhale 1 puff into the lungs every 4 (four) hours as needed for wheezing or shortness of breath.      Marland Kitchen albuterol (PROVENTIL) (2.5 MG/3ML) 0.083% nebulizer solution Take 2.5 mg by nebulization every 6 (six) hours as needed for wheezing or shortness of breath.      . Fluticasone-Salmeterol (ADVAIR) 250-50 MCG/DOSE AEPB Inhale 1 puff into the lungs 2 (two) times daily.      Marland Kitchen loratadine (CLARITIN) 10 MG tablet Take 10 mg by mouth daily.      . Prenatal Vit-Fe Fumarate-FA (PRENATAL MULTIVITAMIN) TABS tablet Take 1 tablet by mouth daily at 12 noon.      . RABEprazole (ACIPHEX) 20 MG tablet Take 1 tablet (20 mg total) by mouth daily.  30  tablet  0   No current facility-administered medications for this visit.       Review of Systems:     Cardiac Review of Systems: Y or N  Chest Pain [  n  ]  Resting SOB [n   ] Exertional SOB  [ x asthma ]  Orthopnea [ n ]   Pedal Edema [ n  ]    Palpitations [n  ] Syncope  [n  ]   Presyncope [ n  ]  General Review of Systems: [Y] = yes [  ]=no Constitional: recent weight change [ n ]; anorexia [ n ]; fatigue [n  ]; nausea [  n]; night sweats [  ]; fever [  ]; or chills [  ];                                                                                                                                          Dental: poor dentition[ n ]; Last Dentist visit: July 2013  Eye : blurred vision [  ]; diplopia [   ]; vision changes [  ];  Amaurosis fugax[  ]; Resp: cough [  ];  wheezing[  ];  hemoptysis[ n ]; shortness of breath[  ]; paroxysmal nocturnal dyspnea[  ]; dyspnea on exertion[  ]; or orthopnea[  ];  GI:  gallstones[  ], vomiting[  ];  dysphagia[  ]; melena[  ];  hematochezia [  ]; heartburn[  ];   Hx of  Colonoscopy[  ]; GU: kidney stones [  ]; hematuria[  ];   dysuria [  ];  nocturia[  ];  history of     obstruction [  ]; urinary frequency [  ]             Skin: rash, swelling[  ];, hair loss[  ];  peripheral edema[  ];  or itching[  ]; Musculosketetal: myalgias[  ];  joint swelling[  ];  joint erythema[  ];  joint pain[  ];  back pain[  ];  Heme/Lymph: bruising[  ];  bleeding[  ];  anemia[  ];  Neuro: TIA[  ];  headaches[n  ];  stroke[n  ];  vertigo[  ];  seizures[ n ];   paresthesias[  ];  difficulty walking[  ];  Psych:depression[  ]; anxiety[ n ];  Endocrine: diabetes[ n ];  thyroid dysfunction[ n ];  Immunizations: Flu [ n ]; Pneumococcal[n  ];  Other:  Physical Exam :BP 110/76  Pulse 100  Resp 20  Ht 5\' 5"  (1.651 m)  Wt 154 lb (69.854 kg)  BMI 25.63 kg/m2  SpO2 98%  LMP 07/07/2012   General appearance: alert, cooperative and appears stated age Neurologic:  intact Heart: regular rate and rhythm, S1, S2 normal, II/VI systolic murmur at left sternal border, click, rub or gallop, or Lungs: clear to auscultation bilaterally and normal percussion bilaterally Abdomen: soft, non-tender; bowel sounds normal; no masses,  no organomegaly Extremities: extremities normal, atraumatic, no cyanosis or edema and Homans sign is negative, no sign of DVT no sign of marfan's    Diagnostic Studies & Laboratory data:     Recent Radiology Findings:  CLINICAL DATA: Congenitally abnormal AV with dilated Aortic root  Comparison MRI done 01/05/2013  EXAM:  CARDIAC MRI  TECHNIQUE:  The patient was scanned on a 1.5 Tesla GE magnet. A dedicated  cardiac coil was used. Functional imaging was done using Fiesta  sequences. 2,3, and 4 chamber views were done to assess for RWMA's.  Modified Simpson's rule using a short axis stack was used to  calculate an ejection fraction on a dedicated work Brewing technologist. The aorta was measured from double oblique axial  IIR black blood sequences as well as double oblique coronary and  left lateral Fiesta sequences. No gadolinium was used and no MRA was  done due to the patients pregnancy  CONTRAST: 0  FINDINGS:  All 4 cardiac chambers were normal in size and function. There was  no ASD/VSD or pericardial effusion. The mitral and tricuspid valves  were normal. The aortic valve was dysplastic and functionally  bicuspid with fused right and left cusps. There was no significant  AR and turbulence suggests some degree of AS. The ascending aortic  root was moderately dilated. The widest area at the level of the RPA  measured consistently between 3.9-4.1 cm in axial, double oblique  and coronary views. The great vessels arose from the arch normally.  There was no coarctation.  Sinus of Valsalva: 3.2 cm  Sinotubular Junction: 2.3 cm  Ascending Aortic Root: 4.1 cm  Aortic Arch: 2.0 cm  Descending Thoracic Aorta: 1.7 cm   The quantitative EF was 55% (EDV 111cc, ESV 50cc SV 61cc)  IMPRESSION:  1) Stable moderate absolute ascending aortic root dilatation at 4.1  cm at level of RPA No change compared to MRI done 01/05/13. Note  relative to her descending thoracic aorta which is only  1.7 cm this would indicate more severe relative enlargement  2) Dysplastic AV functionally bicuspid Suggest echo correlation for  degree of stenosis  3) Normal LV size and function EF 55%  Jenkins Rouge  Electronically Signed  By: Jenkins Rouge M.D.  On: 06/17/2013 13:25  Mr Jodene Nam Chest W Contrast  01/14/2013   Cardiac MRI/ Aortic MRA  Indication: Bicuspid AV  Protocol:  The patient was scanned on a 1.5 Tesla GE magnet a dedicated cardiac coil was used. Functional imaging was done using Fiesta sequences. Aortic root measurements  were made from [place], IIR and MRA images.  The patient received gadolinium.  Quantitative EF was calculated using Simpsons method on a dedicated Circle work station. Flow imaging was used to assess AS/AR  Findings:  LV cavity size and function were normal. EF 53 % ( EDV 124cc, ESV 59cc SV 65cc) There was no scar or delyes enhancement.  The LA/RA and RV were normal There was no ASD/VSD or pericardial effusion.  There was a bicuspid AV.  There was turbulence through the valve on cine images. Peak gradient on flow imaging was 14 mmHg  Planimetry of valve area was 2cm2 The regurgitant fraction was only 7% These findings would indicate mild AS/AR  MRA:  The ascending aortic root was moderately dilated.  The great vessels arose normally from the arch. There was no coarctation.  Ascending Aortic Root: 4.1 cm Aortic Arch 1.8 cm Descending Thoracic Aorta: 1.7 cm  Impression:     1)    Bicuspid AV with mild AS/AR Echo correlation suggested        2)    Normal LV size and function EF 53% 3)    Moderate Ascending Aortic root dilatation 4.1 cm at level of RMPA relative to a descending thoracic aorta of only 1.7 cm Suggest Echo in a  year and aortic MRA with IIR imaging in 6 months  Jenkins Rouge MD [facility]   Original Report Authenticated By: Jenkins Rouge, M.D.   CT of the Chest 08/2012:  Joneen Boers L. [place] Crane, MD Fri Jan 15, 2013 2:24:40 PM EDT    **ADDENDUM** CREATED: 2013-01-15 14:24:38  Study reviewed on 01/15/2013.  There is fusiform enlargement of the ascending aorta up to 39 - 40  mm by CTA (see coronal image 40 and series 4 image 42). This  tapers in the region of the proximal aortic arch. The distal arch  and descending thoracic aortic caliber then appear normal.  This corresponds to the finding on the subsequent cardiac MRI  01/12/2013, and this finding was also noted on cardiac MRI dated  09/04/2010 (measured to be 39 mm diameter at that time).  Study discussed by telephone with Dr. Servando Snare on 01/15/2013 at  1415 hours.  **END ADDENDUM** SIGNED BY: H.LEE HALL III, M.D.   Study Result    *RADIOLOGY REPORT*  Clinical Data: 28 year old female undergoing IVF with egg retrieval  this AM. Sudden onset chest pain and shortness of breath. Pain  radiating to the right shoulder.  CT ANGIOGRAPHY CHEST  Technique: Multidetector CT imaging of the chest using the  standard protocol during bolus administration of intravenous  contrast. Multiplanar reconstructed images including MIPs were  obtained and reviewed to evaluate the vascular anatomy.  Contrast: 117mL OMNIPAQUE IOHEXOL 350 MG/ML SOLN  Comparison: Chest radiographs 04/02/2006.  Findings: Adequate contrast bolus timing in the pulmonary arterial  tree. Occasional mild respiratory motion. Streak artifact from  dense contrast in the SVC. No focal filling defect identified in  the pulmonary arterial tree to suggest the presence of acute  pulmonary embolism.  Major airways are patent. No abnormal pulmonary opacity. No  pleural or pericardial effusion.  Negative visualized aorta and great vessels. Negative thoracic  inlet. Small volume residual thymus in the mediastinum.  No  mediastinal lymphadenopathy.  Upper abdomen remarkable for small volume free fluid adjacent to  the liver and spleen. Small volume fluid in Morison's pouch.  Excreted IV contrast in the visible renal collecting systems.  No acute osseous abnormality identified.  IMPRESSION:  1. No evidence  of acute pulmonary embolus. No acute findings  identified in the chest. 2. Small volume ascites in the upper  abdomen. Consider ovarian hyperstimulation syndrome in this  setting.  Original Report Authenticated By: Roselyn Reef, M.D.    09/05/2010: Clinical Data: Evaluate for aortic coarctation  MR CARDIA MORPHOLOGY WITHOUT CONTRAST  GE 1.5 T magnet with dedicated cardiac coil. FIESTA sequences for  function and morphology. MR angiography was done using Multihance  contrast. EF was calculated at a dedicated workstation.  Comparison: None.  Findings: The left ventricle was normal in size with no LV  hypertrophy. Normal wall motion. EF was calculated to be 68%.  Normal right ventricular size and systolic function. The left and  right atria were normal in size. The interatrial septum appeared  intact. There was no significant mitral regurgitation. The aortic  valve was congenitally abnormal and probably functionally bicuspid  with what appeared to be fusion of the raphe between the left and  right coronary cusps. There did not appear to be signficant aortic  stenosis. There was probably trivial aortic insufficiency.  On MR angiography, the ascending aorta was dilated to about 3.9 cm.  The remainder of the thoracic aorta was normal in caliber. There  was no aortic coarctation. The arch vessels originated normally.  The pulmonary veins drained normally to the left atrium.  Measurements:  LV EDV 128 mL  LV SV 87 mL  LV EF 68%  Aortic root 3.1 cm  Ascending aorta at maximal dimension 3.9 cm  Arch 2.0 cm  Isthmus 1.5 cm  Descending thoracic aorta 1.7 cm  IMPRESSION:  1. Normal LV and RV size and  systolic function.  2. Congenitally abnormal aortic valve that appeared to be  functionally bicuspid with fusion of the raphe between left and  right coronary cusps. Would confirm this by echo as there was  signficant MRI artifact around the area of the aortic valve. There  did not appear to be signficant aortic stenosis and there was  probably trivial aortic insufficiency.  3. There was dilation of the ascending aorta to 3.9 cm. This  could be due to aortopathy related to bicuspid aortic valve  disorder. There was no significant aortic coarctation.  Original Report Authenticated By: [id number]  Recent Lab Findings: Lab Results  Component Value Date   WBC 11.1* 08/28/2012   HGB 10.4* 08/28/2012   HCT 31.1* 08/28/2012   PLT 246 08/28/2012   GLUCOSE 109* 08/28/2012   NA 136 08/28/2012   K 3.8 08/28/2012   CL 101 08/28/2012   CREATININE 0.72 01/12/2013   BUN 8 01/12/2013   CO2 25 08/28/2012   Aortic Size Index=      4.1  /Body surface area is 1.79 meters squared. = 2.29  < 2.75 cm/m2      4% risk per year 2.75 to 4.25          8% risk per year > 4.25 cm/m2    20% risk per year This data not specific for  Pregnancy   cross sectional area of aorta cm2/height in meters > 10 indication for surgery 13.2/1.65= 8.0    Assessment / Plan:     Patient with congential dysplastic aortic vale and dilated ascending aorta 4.1 cm with no history or family history of aortic disease. Her aortic valve appears on scans to be functionally   bicuspid.  No genetic testing or consultation  for mutations for   genes known to predispose to thoracic aortic aneurysms and  dissections,such as FBN1, TGFBR1, TGFBR2, ACTA2,and MYH1 has been done.   The   Recommendations from :  2010 ACCF/AHA/AATS/ACR/ASA/SCA/SCAI/SIR/STS/SVM Guidelines for the Diagnosis and Management of Patients With Thoracic Aortic Disease  20. Recommendations for Counseling and Management of Chronic Aortic Diseases in Pregnancy Class I 1.  Women with Marfan syndrome and aortic dilatation, as well as patients without Marfan syndrome who have known aortic disease, should be counseled about the risk of aortic dissection as well as the heritable nature of the disease prior to pregnancy. 40,141 (Level of Evidence: C)  2. For pregnant women with known thoracic aortic dilatation or a familial or genetic predisposition for aortic dissection, strict blood pressure control specifically to prevent Stage II hypertension, is recommended. (Level of Evidence: C)  3. For all pregnant women with known aortic root or ascending aortic dilatation, monthly or bimonthly echocardiographic measurements of the ascending aortic dimensions are recommended to detect aortic expansion until birth. (Level of Evidence: C)  4. For imaging of pregnant women with aortic arch, descending, or abdominal aortic dilatation, magnetic resonance imaging (without gadolinium) is recommended over computed tomographic imaging to avoid exposing both the mother and fetus to ionizing radiation. Transesophageal echocardiogram is an option for imaging of the thoracic aorta. (Level of Evidence: C)  5. Pregnant women with aortic aneurysms should be delivered where cardiothoracic surgery is available. (Level of Evidence: C)  Class IIa 1. Fetal delivery via cesarean section is reasonable for patients with significant aortic enlargement, dissection, or severe aortic valve regurgitation.141 (Level of Evidence: C) Class IIb  1. If progressive aortic dilatation and/or advancing aortic valve regurgitation are documented, prophylactic surgery may be considered.142 (Level of Evidence: C)  I Patient is now pregnant [redacted] weeks  After discussion with OB in review of recommendations the patient has decided she would like to go to [place], [facility] or possibly [facility] for delivery each with cardiac surgical services OB services in the endocrinology services all under one  roof, so C-section could be done in the same institution that has cardiac surgical support. I've offered to assist her in referrals to cardiac surgery at other institutions after she decides where she would like to deliver and has established OB referral.  I plan to see her back February 12.  Grace Isaac MD      [address] [place] [address] Office [phone]   Beeper [id number]  07/19/2013 5:11 PM

## 2013-08-09 ENCOUNTER — Other Ambulatory Visit: Payer: Self-pay | Admitting: General Surgery

## 2013-08-09 ENCOUNTER — Other Ambulatory Visit (HOSPITAL_COMMUNITY): Payer: Self-pay | Admitting: Cardiology

## 2013-08-09 ENCOUNTER — Ambulatory Visit (HOSPITAL_COMMUNITY): Payer: Managed Care, Other (non HMO) | Attending: Cardiology | Admitting: Cardiology

## 2013-08-09 ENCOUNTER — Other Ambulatory Visit (HOSPITAL_COMMUNITY): Payer: Managed Care, Other (non HMO)

## 2013-08-09 DIAGNOSIS — Q231 Congenital insufficiency of aortic valve: Secondary | ICD-10-CM | POA: Insufficient documentation

## 2013-08-09 DIAGNOSIS — I716 Thoracoabdominal aortic aneurysm, without rupture, unspecified: Secondary | ICD-10-CM

## 2013-08-09 DIAGNOSIS — I712 Thoracic aortic aneurysm, without rupture, unspecified: Secondary | ICD-10-CM | POA: Insufficient documentation

## 2013-08-09 DIAGNOSIS — O9989 Other specified diseases and conditions complicating pregnancy, childbirth and the puerperium: Principal | ICD-10-CM

## 2013-08-09 DIAGNOSIS — I77819 Aortic ectasia, unspecified site: Secondary | ICD-10-CM | POA: Insufficient documentation

## 2013-08-09 DIAGNOSIS — I77810 Thoracic aortic ectasia: Secondary | ICD-10-CM

## 2013-08-09 DIAGNOSIS — I359 Nonrheumatic aortic valve disorder, unspecified: Secondary | ICD-10-CM | POA: Insufficient documentation

## 2013-08-09 DIAGNOSIS — O99891 Other specified diseases and conditions complicating pregnancy: Secondary | ICD-10-CM | POA: Insufficient documentation

## 2013-08-09 DIAGNOSIS — I059 Rheumatic mitral valve disease, unspecified: Secondary | ICD-10-CM | POA: Insufficient documentation

## 2013-08-09 NOTE — Progress Notes (Signed)
Limited echo performed. 

## 2013-08-12 ENCOUNTER — Ambulatory Visit: Payer: Managed Care, Other (non HMO) | Admitting: Cardiothoracic Surgery

## 2013-08-19 ENCOUNTER — Ambulatory Visit: Payer: Managed Care, Other (non HMO) | Admitting: Cardiothoracic Surgery

## 2013-08-23 ENCOUNTER — Ambulatory Visit: Payer: Managed Care, Other (non HMO) | Admitting: Cardiology

## 2013-09-30 ENCOUNTER — Encounter: Payer: Self-pay | Admitting: Cardiology

## 2014-01-10 ENCOUNTER — Ambulatory Visit: Payer: Managed Care, Other (non HMO) | Admitting: Cardiology

## 2014-01-26 ENCOUNTER — Encounter (HOSPITAL_COMMUNITY): Payer: Self-pay | Admitting: *Deleted

## 2014-02-28 ENCOUNTER — Ambulatory Visit: Payer: Managed Care, Other (non HMO) | Admitting: Cardiology

## 2014-05-02 ENCOUNTER — Encounter (HOSPITAL_COMMUNITY): Payer: Self-pay | Admitting: *Deleted

## 2017-01-19 ENCOUNTER — Inpatient Hospital Stay (HOSPITAL_COMMUNITY)
Admission: AD | Admit: 2017-01-19 | Discharge: 2017-01-19 | Disposition: A | Discharge status: Home / Self Care | Payer: 59 | Source: Ambulatory Visit | Attending: Obstetrics and Gynecology | Admitting: Obstetrics and Gynecology

## 2017-01-19 ENCOUNTER — Encounter (HOSPITAL_COMMUNITY): Payer: Self-pay

## 2017-01-19 DIAGNOSIS — K5900 Constipation, unspecified: Secondary | ICD-10-CM | POA: Insufficient documentation

## 2017-01-19 DIAGNOSIS — O4702 False labor before 37 completed weeks of gestation, second trimester: Secondary | ICD-10-CM | POA: Diagnosis not present

## 2017-01-19 DIAGNOSIS — O26892 Other specified pregnancy related conditions, second trimester: Secondary | ICD-10-CM | POA: Diagnosis not present

## 2017-01-19 DIAGNOSIS — Z3A24 24 weeks gestation of pregnancy: Secondary | ICD-10-CM | POA: Insufficient documentation

## 2017-01-19 DIAGNOSIS — O99612 Diseases of the digestive system complicating pregnancy, second trimester: Secondary | ICD-10-CM

## 2017-01-19 DIAGNOSIS — O479 False labor, unspecified: Secondary | ICD-10-CM | POA: Diagnosis not present

## 2017-01-19 LAB — URINALYSIS, ROUTINE W REFLEX MICROSCOPIC
Bilirubin Urine: NEGATIVE
GLUCOSE, UA: NEGATIVE mg/dL
Hgb urine dipstick: NEGATIVE
KETONES UR: NEGATIVE mg/dL
LEUKOCYTES UA: NEGATIVE
Nitrite: NEGATIVE
PH: 7 (ref 5.0–8.0)
Protein, ur: NEGATIVE mg/dL
SPECIFIC GRAVITY, URINE: 1.001 — AB (ref 1.005–1.030)

## 2017-01-19 NOTE — MAU Provider Note (Signed)
Chief Complaint:  Abdominal Cramping   First Provider Initiated Contact with Patient 01/19/17 2226      HPI: Leslie Sutton is a 31 y.o. G3P1011 at [redacted]w[redacted]d who presents to maternity admissions reporting an episode of cramping/contractions 5-6 x/hour for 1 hour, then every 20-30 minutes afterwards. The contractions started while the pt was lying down taking a nap with her 32 year old.  She reports some irregular cramping before today but nothing this uncomfortable or frequent. She reports fewer contractions by the time she arrives in MAU.  She reports some mild constipation in the last few days. There are no other associated symptoms.  She has not tried any treatments other than resting and drinking water. She reports good fetal movement, denies LOF, vaginal bleeding, vaginal itching/burning, urinary symptoms, h/a, dizziness, n/v, or fever/chills.    HPI  Past Medical History: Past Medical History:  Diagnosis Date  . Anxiety   . Aortic aneurysm ([place])   . Aortic stenosis 03/2013   bicuspid AV with mild AS/AI  . Asthma   . Depression   . Dermoid cyst    Removed    Past obstetric history: OB History  Gravida Para Term Preterm AB Living  3 1 1  0 1 1  SAB TAB Ectopic Multiple Live Births  1 0 0 0 1    # Outcome Date GA Lbr Len/2nd Weight Sex Delivery Anes PTL Lv  3 Current           2 SAB           1 Term      Vag-Spont         Past Surgical History: Past Surgical History:  Procedure Laterality Date  . LAPAROTOMY  12/09/2011   Procedure: EXPLORATORY LAPAROTOMY;  Surgeon: Margarette Asal, MD;  Location: [facility];  Service: Gynecology;  Laterality: N/A;  . SALPINGOOPHORECTOMY  12/09/2011   Procedure: SALPINGO OOPHERECTOMY;  Surgeon: Margarette Asal, MD;  Location: [facility];  Service: Gynecology;  Laterality: Right;  . WISDOM TOOTH EXTRACTION      Family History: Family History  Problem Relation Age of Onset  . Scleroderma Sister     Social History: Social History  Substance  Use Topics  . Smoking status: Never Smoker  . Smokeless tobacco: Never Used  . Alcohol use No    Allergies:  Allergies  Allergen Reactions  . Ceclor [Cefaclor] Hives  . Septra [Bactrim] Hives    Meds:  Prescriptions Prior to Admission  Medication Sig Dispense Refill Last Dose  . Fluticasone-Salmeterol (ADVAIR) 250-50 MCG/DOSE AEPB Inhale 1 puff into the lungs 2 (two) times daily.   01/19/2017 at Unknown time  . loratadine (CLARITIN) 10 MG tablet Take 10 mg by mouth daily.   01/19/2017 at Unknown time  . Prenatal Vit-Fe Fumarate-FA (PRENATAL MULTIVITAMIN) TABS tablet Take 1 tablet by mouth daily at 12 noon.   01/19/2017 at Unknown time  . acetaminophen (TYLENOL) 500 MG tablet Take 1,000 mg by mouth every 6 (six) hours as needed (foot pain).   Unknown at Unknown time  . albuterol (PROVENTIL HFA;VENTOLIN HFA) 108 (90 BASE) MCG/ACT inhaler Inhale 1 puff into the lungs every 4 (four) hours as needed for wheezing or shortness of breath.   More than a month at Unknown time  . albuterol (PROVENTIL) (2.5 MG/3ML) 0.083% nebulizer solution Take 2.5 mg by nebulization every 6 (six) hours as needed for wheezing or shortness of breath.   More than a month at Unknown time  .  RABEprazole (ACIPHEX) 20 MG tablet Take 1 tablet (20 mg total) by mouth daily. 30 tablet 0 Taking    ROS:  Review of Systems  Constitutional: Negative for chills, fatigue and fever.  Respiratory: Negative for shortness of breath.   Cardiovascular: Negative for chest pain.  Gastrointestinal: Positive for abdominal pain, constipation and vomiting. Negative for nausea.  Genitourinary: Positive for pelvic pain. Negative for difficulty urinating, dysuria, flank pain, vaginal bleeding, vaginal discharge and vaginal pain.  Neurological: Negative for dizziness and headaches.  Psychiatric/Behavioral: Negative.      I have reviewed patient's Past Medical Hx, Surgical Hx, Family Hx, Social Hx, medications and allergies.   Physical Exam    Patient Vitals for the past 24 hrs:  BP Temp Temp src Pulse Resp SpO2 Height Weight  01/19/17 2118 113/70 98 F (36.7 C) Oral 79 18 100 % 5\' 5"  (1.651 m) 146 lb (66.2 kg)   Constitutional: Well-developed, well-nourished female in no acute distress.  Cardiovascular: normal rate Respiratory: normal effort GI: Abd soft, non-tender, gravid appropriate for gestational age.  MS: Extremities nontender, no edema, normal ROM Neurologic: Alert and oriented x 4.  GU: Neg CVAT.   Dilation: Closed Effacement (%): Thick Cervical Position: Posterior Exam by:: Leftwich-Kirby, CNM  FHT:  Baseline 150 , moderate variability, accelerations present, no decelerations Contractions: None on toco or to palpation   Labs: Results for orders placed or performed during the hospital encounter of 01/19/17 (from the past 24 hour(s))  Urinalysis, Routine w reflex microscopic     Status: Abnormal   Collection Time: 01/19/17  9:14 PM  Result Value Ref Range   Color, Urine COLORLESS (A) YELLOW   APPearance CLEAR CLEAR   Specific Gravity, Urine 1.001 (L) 1.005 - 1.030   pH 7.0 5.0 - 8.0   Glucose, UA NEGATIVE NEGATIVE mg/dL   Hgb urine dipstick NEGATIVE NEGATIVE   Bilirubin Urine NEGATIVE NEGATIVE   Ketones, ur NEGATIVE NEGATIVE mg/dL   Protein, ur NEGATIVE NEGATIVE mg/dL   Nitrite NEGATIVE NEGATIVE   Leukocytes, UA NEGATIVE NEGATIVE      Imaging:  No results found.  MAU Course/MDM: I have ordered labs and reviewed results.  NST reviewed and reactive No evidence of preterm labor with closed cervix. Pt report of improved symptoms while in MAU. FFN collected but not sent. Mild constipation may be contributing to abdominal cramping.  Discussed dietary changes and use of OTC Colace and/or Miralax PRN. Consult Grewal with presentation, exam findings and test results.  D/C home, follow up as scheduled in the office Pt stable at time of discharge.   Assessment: 1. Braxton Hicks contractions   2.  Constipation during pregnancy in second trimester     Plan: Discharge home Labor precautions and fetal kick counts  Follow-up Information    Dian Queen, MD Follow up.   Specialty:  Obstetrics and Gynecology Why:  As scheduled, return to MAU as needed for emergencies Contact information: [place] [address] [phone]          Allergies as of 01/19/2017      Reactions   Ceclor [cefaclor] Hives   Septra [bactrim] Hives      Medication List    TAKE these medications   acetaminophen 500 MG tablet Commonly known as:  TYLENOL Take 1,000 mg by mouth every 6 (six) hours as needed (foot pain).   albuterol 108 (90 Base) MCG/ACT inhaler Commonly known as:  PROVENTIL HFA;VENTOLIN HFA Inhale 1 puff into the  lungs every 4 (four) hours as needed for wheezing or shortness of breath.   albuterol (2.5 MG/3ML) 0.083% nebulizer solution Commonly known as:  PROVENTIL Take 2.5 mg by nebulization every 6 (six) hours as needed for wheezing or shortness of breath.   Fluticasone-Salmeterol 250-50 MCG/DOSE Aepb Commonly known as:  ADVAIR Inhale 1 puff into the lungs 2 (two) times daily.   loratadine 10 MG tablet Commonly known as:  CLARITIN Take 10 mg by mouth daily.   prenatal multivitamin Tabs tablet Take 1 tablet by mouth daily at 12 noon.   RABEprazole 20 MG tablet Commonly known as:  ACIPHEX Take 1 tablet (20 mg total) by mouth daily.       Fatima Blank Certified Nurse-Midwife 01/19/2017 10:55 PM

## 2017-01-19 NOTE — MAU Note (Signed)
Pt reports lower abdominal cramping and contractions that started around 4:30pm. States she was not concerned, until about an hour ago when she started having lower abdominal pressure and back pain. Pt denies LOF or vaginal bleeding. Reports good fetal movement.

## 2017-07-30 ENCOUNTER — Encounter (HOSPITAL_COMMUNITY): Payer: Self-pay

## 2017-10-03 ENCOUNTER — Other Ambulatory Visit: Payer: Self-pay

## 2017-10-03 ENCOUNTER — Emergency Department (HOSPITAL_COMMUNITY): Payer: 59

## 2017-10-03 ENCOUNTER — Emergency Department (HOSPITAL_COMMUNITY)
Admission: EM | Admit: 2017-10-03 | Discharge: 2017-10-04 | Disposition: A | Discharge status: Home / Self Care | Payer: 59 | Attending: Emergency Medicine | Admitting: Emergency Medicine

## 2017-10-03 ENCOUNTER — Encounter (HOSPITAL_COMMUNITY): Payer: Self-pay

## 2017-10-03 DIAGNOSIS — I4891 Unspecified atrial fibrillation: Secondary | ICD-10-CM

## 2017-10-03 DIAGNOSIS — J45909 Unspecified asthma, uncomplicated: Secondary | ICD-10-CM | POA: Diagnosis not present

## 2017-10-03 DIAGNOSIS — E0590 Thyrotoxicosis, unspecified without thyrotoxic crisis or storm: Secondary | ICD-10-CM | POA: Insufficient documentation

## 2017-10-03 DIAGNOSIS — Z79899 Other long term (current) drug therapy: Secondary | ICD-10-CM | POA: Diagnosis not present

## 2017-10-03 DIAGNOSIS — R002 Palpitations: Secondary | ICD-10-CM | POA: Diagnosis present

## 2017-10-03 LAB — BASIC METABOLIC PANEL
Anion gap: 10 (ref 5–15)
BUN: 9 mg/dL (ref 6–20)
CO2: 25 mmol/L (ref 22–32)
Calcium: 9.6 mg/dL (ref 8.9–10.3)
Chloride: 106 mmol/L (ref 101–111)
Creatinine, Ser: 0.51 mg/dL (ref 0.44–1.00)
GFR calc Af Amer: >60 mL/min (ref >60)
GFR calc non Af Amer: >60 mL/min (ref >60)
Glucose, Bld: 101 mg/dL — ABNORMAL HIGH (ref 65–99)
POTASSIUM: 3.9 mmol/L (ref 3.5–5.1)
Sodium: 141 mmol/L (ref 135–145)

## 2017-10-03 LAB — CBC
HCT: 37.8 % (ref 36.0–46.0)
HEMOGLOBIN: 12.4 g/dL (ref 12.0–15.0)
MCH: 29 pg (ref 26.0–34.0)
MCHC: 32.8 g/dL (ref 30.0–36.0)
MCV: 88.5 fL (ref 78.0–100.0)
Platelets: 279 10*3/uL (ref 150–400)
RBC: 4.27 MIL/uL (ref 3.87–5.11)
RDW: 12.4 % (ref 11.5–15.5)
WBC: 5.2 10*3/uL (ref 4.0–10.5)

## 2017-10-03 LAB — PHOSPHORUS: Phosphorus: 4.8 mg/dL — ABNORMAL HIGH (ref 2.5–4.6)

## 2017-10-03 LAB — I-STAT TROPONIN, ED: TROPONIN I, POC: 0 ng/mL (ref 0.00–0.08)

## 2017-10-03 LAB — T4, FREE: FREE T4: 3.95 ng/dL — AB (ref 0.61–1.12)

## 2017-10-03 LAB — TSH: TSH: <0.01 u[IU]/mL — ABNORMAL LOW (ref 0.350–4.500)

## 2017-10-03 LAB — I-STAT BETA HCG BLOOD, ED (MC, WL, AP ONLY)

## 2017-10-03 LAB — MAGNESIUM: MAGNESIUM: 2 mg/dL (ref 1.7–2.4)

## 2017-10-03 MED ORDER — DILTIAZEM HCL-DEXTROSE 100-5 MG/100ML-% IV SOLN (PREMIX)
5.0000 mg/h | INTRAVENOUS | Status: DC
  Administered 2017-10-03: 5 mg/h via INTRAVENOUS
  Filled 2017-10-03: qty 100

## 2017-10-03 MED ORDER — SODIUM CHLORIDE 0.9 % IV BOLUS (SEPSIS)
1000.0000 mL | Freq: Once | INTRAVENOUS | Status: AC
  Administered 2017-10-03: 1000 mL via INTRAVENOUS

## 2017-10-03 MED ORDER — ENOXAPARIN (LOVENOX) PATIENT EDUCATION KIT: PACK | Freq: Two times a day (BID) | Status: DC

## 2017-10-03 MED ORDER — METOPROLOL TARTRATE 5 MG/5ML IV SOLN
INTRAVENOUS | Status: AC
  Administered 2017-10-03: 2.5 mg
  Filled 2017-10-03: qty 5

## 2017-10-03 MED ORDER — SODIUM CHLORIDE 0.9 % IV SOLN
1000.0000 mL | INTRAVENOUS | Status: DC
  Administered 2017-10-03: 1000 mL via INTRAVENOUS

## 2017-10-03 MED ORDER — ENOXAPARIN SODIUM 60 MG/0.6ML ~~LOC~~ SOLN
1.0000 mg/kg | Freq: Once | SUBCUTANEOUS | Status: AC
Start: 2017-10-03 — End: 2017-10-03
  Administered 2017-10-03: 21:00:00 55 mg via SUBCUTANEOUS
  Filled 2017-10-03: qty 0.6

## 2017-10-03 MED ORDER — METOPROLOL SUCCINATE 12.5 MG HALF TABLET
12.5000 mg | ORAL_TABLET | Freq: Once | ORAL | Status: AC
  Administered 2017-10-04: 12.5 mg via ORAL
  Filled 2017-10-03: qty 1

## 2017-10-03 MED ORDER — ETOMIDATE 2 MG/ML IV SOLN
0.1500 mg/kg | Freq: Once | INTRAVENOUS | Status: AC
  Administered 2017-10-03: 8.5 mg via INTRAVENOUS
  Filled 2017-10-03: qty 10

## 2017-10-03 MED ORDER — DILTIAZEM LOAD VIA INFUSION
15.0000 mg | Freq: Once | INTRAVENOUS | Status: AC
Start: 2017-10-03 — End: 2017-10-03
  Administered 2017-10-03: 15 mg via INTRAVENOUS
  Filled 2017-10-03: qty 15

## 2017-10-03 MED ORDER — METOPROLOL SUCCINATE ER 25 MG PO TB24: 12.5000 mg | ORAL_TABLET | Freq: Every day | ORAL | 1 refills | Status: AC

## 2017-10-03 MED ORDER — ENOXAPARIN SODIUM 30 MG/0.3ML ~~LOC~~ SOLN: 55.0000 mg | Freq: Two times a day (BID) | SUBCUTANEOUS | 1 refills | Status: AC

## 2017-10-03 NOTE — ED Notes (Signed)
Pt resting comfortably. Family remains at the bedside.

## 2017-10-03 NOTE — ED Notes (Signed)
Pt request this RN stop Cardizem drip at this time.

## 2017-10-03 NOTE — ED Triage Notes (Signed)
Pt endorses racing heart beat since 1130 this morning. Pt has hx of aortic stenosis but no hx of arrythmia. Pt endorses ha and dizziness. Axox4.

## 2017-10-03 NOTE — ED Notes (Signed)
2.5 mg metoprolol given per verbal order

## 2017-10-03 NOTE — Sedation Documentation (Signed)
100j shock delivered synced

## 2017-10-03 NOTE — ED Provider Notes (Signed)
[facility] EMERGENCY DEPARTMENT Provider Note   CSN: [id number] Arrival date & time: 10/03/17  1651     History   Chief Complaint Chief Complaint  Patient presents with  . Palpitations    HPI Leslie Sutton is a 32 y.o. female.  HPI Patient reports she started noticing her heart racing this morning.  It was coming and going a couple of times.  She reports she thought it was just because she was very busy and under some stress.  She reports however at about 1130 it continued without slowing down.  She called her husband who is a Airline pilot and was instructed to use her monitor.  She reports her heart rate was showing rates from 160s-180s.  At that time they sought treatment in the emergency department.  She denies she has any chest pain.  She reports she can just feel the fluttering.  No shortness of breath.  No syncope.  She reports slight frontal headache.  Mildly dizzy.  Patient denies similar symptoms.  No history of atrial fibrillation.  Patient does have history of bicuspid aortic valve and some aortic dilation.  She is postpartum by 5 months.  She reports because of these findings she was treated by cardiology for possible high risk.  There were no complications.  She reports that she was told that sometimes she might need an aortic valve replacement but not imminently.  Patient denies any family history of dysrhythmia.  No sudden death in first-degree or second-degree relatives.  Patient reports a daily cup of coffee but not large consumption of caffeinated beverages.  No smoking.  Patient is currently nursing. Past Medical History:  Diagnosis Date  . Anxiety   . Aortic aneurysm ([place])   . Aortic stenosis 03/2013   bicuspid AV with mild AS/AI  . Asthma   . Depression   . Dermoid cyst    Removed    Patient Active Problem List   Diagnosis Date Noted  . Sinus tachycardia 06/22/2013  . Asthma in adult 06/08/2013  . Allergic rhinitis 06/08/2013  . Bicuspid  aortic valve 03/23/2013  . Aortic root dilation ([place]) 03/23/2013  . 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 Aortic stenosis 01/15/2013    Past Surgical History:  Procedure Laterality Date  . LAPAROTOMY  12/09/2011   Procedure: EXPLORATORY LAPAROTOMY;  Surgeon: Margarette Asal, MD;  Location: [facility];  Service: Gynecology;  Laterality: N/A;  . SALPINGOOPHORECTOMY  12/09/2011   Procedure: SALPINGO OOPHERECTOMY;  Surgeon: Margarette Asal, MD;  Location: [facility];  Service: Gynecology;  Laterality: Right;  . WISDOM TOOTH EXTRACTION       OB History    Gravida  3   Para  1   Term  1   Preterm  0   AB  1   Living  1     SAB  1   TAB  0   Ectopic  0   Multiple  0   Live Births  1            Home Medications    Prior to Admission medications   Medication Sig Start Date End Date Taking? Authorizing Provider  Fluticasone-Salmeterol (ADVAIR) 250-50 MCG/DOSE AEPB Inhale 1 puff into the lungs 2 (two) times daily.   Yes [provider]  Prenatal Vit-Fe Fumarate-FA (PRENATAL MULTIVITAMIN) TABS tablet Take 1 tablet by mouth daily at 12 noon.   Yes [provider]  enoxaparin (LOVENOX) 30 MG/0.3ML injection Inject  0.55 mLs (55 mg total) into the skin every 12 (twelve) hours. 10/03/17   Charlesetta Shanks, MD  metoprolol succinate (TOPROL-XL) 25 MG 24 hr tablet Take 0.5 tablets (12.5 mg total) by mouth daily. 10/03/17   Charlesetta Shanks, MD  RABEprazole (ACIPHEX) 20 MG tablet Take 1 tablet (20 mg total) by mouth daily. Patient not taking: Reported on 10/03/2017 05/08/13   Virginia Rochester, NP    Family History Family History  Problem Relation Age of Onset  . Scleroderma Sister     Social History Social History   Tobacco Use  . Smoking status: Never Smoker  . Smokeless tobacco: Never Used  Substance Use Topics  . Alcohol use: No  . Drug use: No     Allergies   Ceclor [cefaclor] and Septra [bactrim]   Review of  Systems Review of Systems 10 Systems reviewed and are negative for acute change except as noted in the HPI.   Physical Exam Updated Vital Signs BP 103/70   Pulse 98   Temp 98 F (36.7 C) (Oral)   Resp (!) 29   Ht 5' 5"  (1.651 m)   Wt 56.7 kg (125 lb)   LMP 07/01/2016 (Exact Date)   SpO2 98%   Breastfeeding? Yes   BMI 20.80 kg/m   Physical Exam Constitutional: Patient is alert and appropriate.  No acute distress.  No respiratory distress. HEENT: Normocephalic atraumatic.  Mucous membranes pink and moist.  Oropharynx widely patent.  No thyromegaly. Cardiovascular: Tachycardia irregularly irregular cannot appreciate murmur gallop at this rate. Respiratory: Clear to auscultation.  No wheeze rhonchi or rale Abdomen: Soft nontender no distention. Extremities: No peripheral edema calves are soft nontender. Neurologic: Alert and appropriate.  All movements coordinated purposeful symmetric.  Mental status clear. Skin: Warm and dry.  ED Treatments / Results  Labs (all labs ordered are listed, but only abnormal results are displayed) Labs Reviewed  BASIC METABOLIC PANEL - Abnormal; Notable for the following components:      Result Value   Glucose, Bld 101 (*)    All other components within normal limits  PHOSPHORUS - Abnormal; Notable for the following components:   Phosphorus 4.8 (*)    All other components within normal limits  TSH - Abnormal; Notable for the following components:   TSH <0.010 (*)    All other components within normal limits  T4, FREE - Abnormal; Notable for the following components:   Free T4 3.95 (*)    All other components within normal limits  CBC  MAGNESIUM  T3, FREE  I-STAT TROPONIN, ED  I-STAT BETA HCG BLOOD, ED ([facility], [facility], [facility] ONLY)    EKG EKG Interpretation  Date/Time:  Friday October 03 2017 21:07:58 EDT Ventricular Rate:  98 PR Interval:    QRS Duration: 90 QT Interval:  313 QTC Calculation: 400 R Axis:   45 Text Interpretation:  Sinus  rhythm Normal ECG When compared with ECG of EARLIER SAME DATE Sinus rhythm has replaced Atrial fibrillation with rapid ventricular response Reconfirmed by Delora Fuel ([id number]) on 10/03/2017 11:48:39 PM   Radiology Dg Chest Portable 1 View  Result Date: 10/03/2017 CLINICAL DATA:  Atrial fibrillation. EXAM: PORTABLE CHEST 1 VIEW COMPARISON:  Chest x-ray 04/02/2006.  CT chest 08/29/2012. FINDINGS: 1719 hours. The cardiopericardial silhouette is within normal limits for size. No edema or focal airspace consolidation. 4-5 mm pulmonary nodule identified over the right mid lung. The visualized bony structures of the thorax are intact. Telemetry leads overlie the chest. IMPRESSION: 1.  No acute cardiopulmonary findings. 2. 4-5 mm nodule projects over the right mid lung. CT chest from 08/29/2012 shows no pulmonary nodule, but there is a similar size breast calcification in the same region. Consider follow-up dedicated PA and lateral two-view chest x-ray to further evaluate after resolution of acute symptoms. Electronically Signed   By: Misty Stanley M.D.   On: 10/03/2017 18:03    Procedures .Cardioversion Date/Time: 10/03/2017 9:12 PM Performed by: Charlesetta Shanks, MD Authorized by: Charlesetta Shanks, MD   Consent:    Consent obtained:  Written   Consent given by:  Patient   Risks discussed:  Pain and induced arrhythmia   Alternatives discussed:  Rate-control medication, alternative treatment, anti-coagulation medication, delayed treatment, observation and referral Pre-procedure details:    Cardioversion basis:  Emergent   Rhythm:  Atrial fibrillation   Electrode placement:  Anterior-posterior Patient sedated: Yes. Refer to sedation procedure documentation for details of sedation.  Attempt one:    Cardioversion mode:  Synchronous   Waveform:  Biphasic   Shock (Joules):  100   Shock outcome:  Conversion to normal sinus rhythm Post-procedure details:    Patient status:  Alert   Patient tolerance of  procedure:  Tolerated well, no immediate complications .Sedation Date/Time: 10/03/2017 9:14 PM Performed by: Charlesetta Shanks, MD Authorized by: Charlesetta Shanks, MD   Consent:    Consent obtained:  Written   Consent given by:  Patient   Risks discussed:  Allergic reaction, dysrhythmia, inadequate sedation, nausea, prolonged hypoxia resulting in organ damage, prolonged sedation necessitating reversal, respiratory compromise necessitating ventilatory assistance and intubation and vomiting Universal protocol:    Procedure explained and questions answered to patient or proxy's satisfaction: yes     Relevant documents present and verified: yes     Test results available and properly labeled: yes     Imaging studies available: yes     Immediately prior to procedure a time out was called: yes     Patient identity confirmation method:  Arm band Indications:    Procedure performed:  Cardioversion   Procedure necessitating sedation performed by:  Physician performing sedation   Intended level of sedation:  Moderate (conscious sedation) Pre-sedation assessment:    Time since last food or drink:  6   ASA classification: class 2 - patient with mild systemic disease     Neck mobility: normal     Mouth opening:  3 or more finger widths   Thyromental distance:  3 finger widths   Mallampati score:  I - soft palate, uvula, fauces, pillars visible   Pre-sedation assessments completed and reviewed: airway patency, cardiovascular function, hydration status, mental status, nausea/vomiting, pain level and respiratory function     Pre-sedation assessment completed:  10/03/2017 8:00 PM Immediate pre-procedure details:    Reassessment: Patient reassessed immediately prior to procedure     Reviewed: vital signs, relevant labs/tests and NPO status     Verified: bag valve mask available, emergency equipment available, intubation equipment available, IV patency confirmed, oxygen available and suction available     Procedure details (see MAR for exact dosages):    Preoxygenation:  Nasal cannula   Sedation:  Etomidate   Intra-procedure monitoring:  Blood pressure monitoring, cardiac monitor, continuous capnometry, continuous pulse oximetry, frequent LOC assessments and frequent vital sign checks   Intra-procedure events: none     Total Provider sedation time (minutes):  20 Comments:     Patient tolerated well.  Immediate sedation.  No airway compromise or respiratory depression.  No  recall of procedure.  Once medication resolved, patient had clear and normal mental status   (including critical care time) CRITICAL CARE Performed by: Si Gaul   Total critical care time: 30 minutes  Critical care time was exclusive of separately billable procedures and treating other patients.  Critical care was necessary to treat or prevent imminent or life-threatening deterioration.  Critical care was time spent personally by me on the following activities: development of treatment plan with patient and/or surrogate as well as nursing, discussions with consultants, evaluation of patient's response to treatment, examination of patient, obtaining history from patient or surrogate, ordering and performing treatments and interventions, ordering and review of laboratory studies, ordering and review of radiographic studies, pulse oximetry and re-evaluation of patient's condition. Medications Ordered in ED Medications  diltiazem (CARDIZEM) 1 mg/mL load via infusion 15 mg (15 mg Intravenous Bolus from Bag 10/03/17 1724)    And  diltiazem (CARDIZEM) 100 mg in dextrose 5% 111m (1 mg/mL) infusion (0 mg/hr Intravenous Paused 10/03/17 1910)  sodium chloride 0.9 % bolus 1,000 mL (0 mLs Intravenous Stopped 10/03/17 2110)    Followed by  0.9 %  sodium chloride infusion (1,000 mLs Intravenous New Bag/Given 10/03/17 1841)  enoxaparin (LOVENOX) patient education kit (has no administration in time range)  metoprolol succinate  (TOPROL-XL) 24 hr tablet 12.5 mg (has no administration in time range)  etomidate (AMIDATE) injection 8.5 mg (8.5 mg Intravenous Given 10/03/17 2058)  enoxaparin (LOVENOX) injection 55 mg (55 mg Subcutaneous Given 10/03/17 2052)  metoprolol tartrate (LOPRESSOR) 5 MG/5ML injection (2.5 mg  Given 10/03/17 2106)     Initial Impression / Assessment and Plan / ED Course  I have reviewed the triage vital signs and the nursing notes.  Pertinent labs & imaging results that were available during my care of the patient were reviewed by me and considered in my medical decision making (see chart for details).    Consult: Cardiology reviewed with Dr. HDebara Pickett  With patient remaining atrial fibrillation RVR after Cardizem administration and blood pressures systolic 997L concurs that patient would likely benefit from cardioversion.  Some reservations regarding patient's past medical history and postpartum state. Consult: [place] Dr. SWadie Lessen on-call for patient's personal cardiologist.  He has been reviewed the patient's medical record.  No identifiable contraindication to cardioversion based on patient's history.  Consider cardioversion with caution.  He does advise the patient should be discharged with anticoagulation  and low-dose beta-blocker if cardioversion successful and patient asymptomatic. Consult: Reviewed with pediatrician on-call to discuss medication and lactation precautions.  We have reviewed medications in [facility] and beta-blockers and Lovenox with low risk in lactation.  Final Clinical Impressions(s) / ED Diagnoses   Final diagnoses:  Atrial fibrillation with rapid ventricular response ([place]  Hyperthyroidism  Patient presents optional above with new onset atrial fibrillation.  Symptoms onset today.  Patient was successfully cardioverted with good response.  Will proceed with Lovenox and Toprol as lowest risk medications associated with lactation in patients necessary  treatment.  She is counseled to call her pediatrician first thing on Monday.  Patient is also identified to be hyper thyroid.  She will take her beta-blocker as prescribed and monitor heart rate.  If blood pressures are stable patient is instructed to go from 12.5 mg to 25 mg as tolerated.  She is instructed on necessity of close follow-up for further diagnostic evaluation of hyperthyroidism.  She has no clinical stigmata of hyperthyroidism.  No palpable thyromegaly or nodules, no exophthalmos, normal  body habitus.  ED Discharge Orders        Ordered    metoprolol succinate (TOPROL-XL) 25 MG 24 hr tablet  Daily     10/03/17 2348    enoxaparin (LOVENOX) 30 MG/0.3ML injection  Every 12 hours     10/03/17 2348       Charlesetta Shanks, MD 10/09/17 [phone]

## 2017-10-04 NOTE — ED Notes (Signed)
Unable to obtain signature d/t computer freezing up. Pt departed in NAD, refused use of wheelchair.

## 2017-10-06 ENCOUNTER — Telehealth (HOSPITAL_COMMUNITY): Payer: Self-pay | Admitting: *Deleted

## 2017-10-06 LAB — T3, FREE: T3, Free: 18.2 pg/mL — ABNORMAL HIGH (ref 2.0–4.4)

## 2017-10-06 NOTE — Telephone Encounter (Signed)
I cld pt to make sure she had follow up appt with cardiology since she was in the ED for afib over the weekend.  Pt states she does have follow up with Dr. Leonides Schanz at [facility] cardiology at 3 pm on Wednesday

## 2019-06-23 ENCOUNTER — Other Ambulatory Visit: Payer: 59

## 2019-08-19 IMAGING — DX DG CHEST 1V PORT
1 series · 1 of 1 positions shown · non-contrast
Comparison: Chest x-ray 04/02/2006.  CT chest 08/29/2012.

CLINICAL DATA: Atrial fibrillation.

EXAM:
PORTABLE CHEST 1 VIEW

[chest]
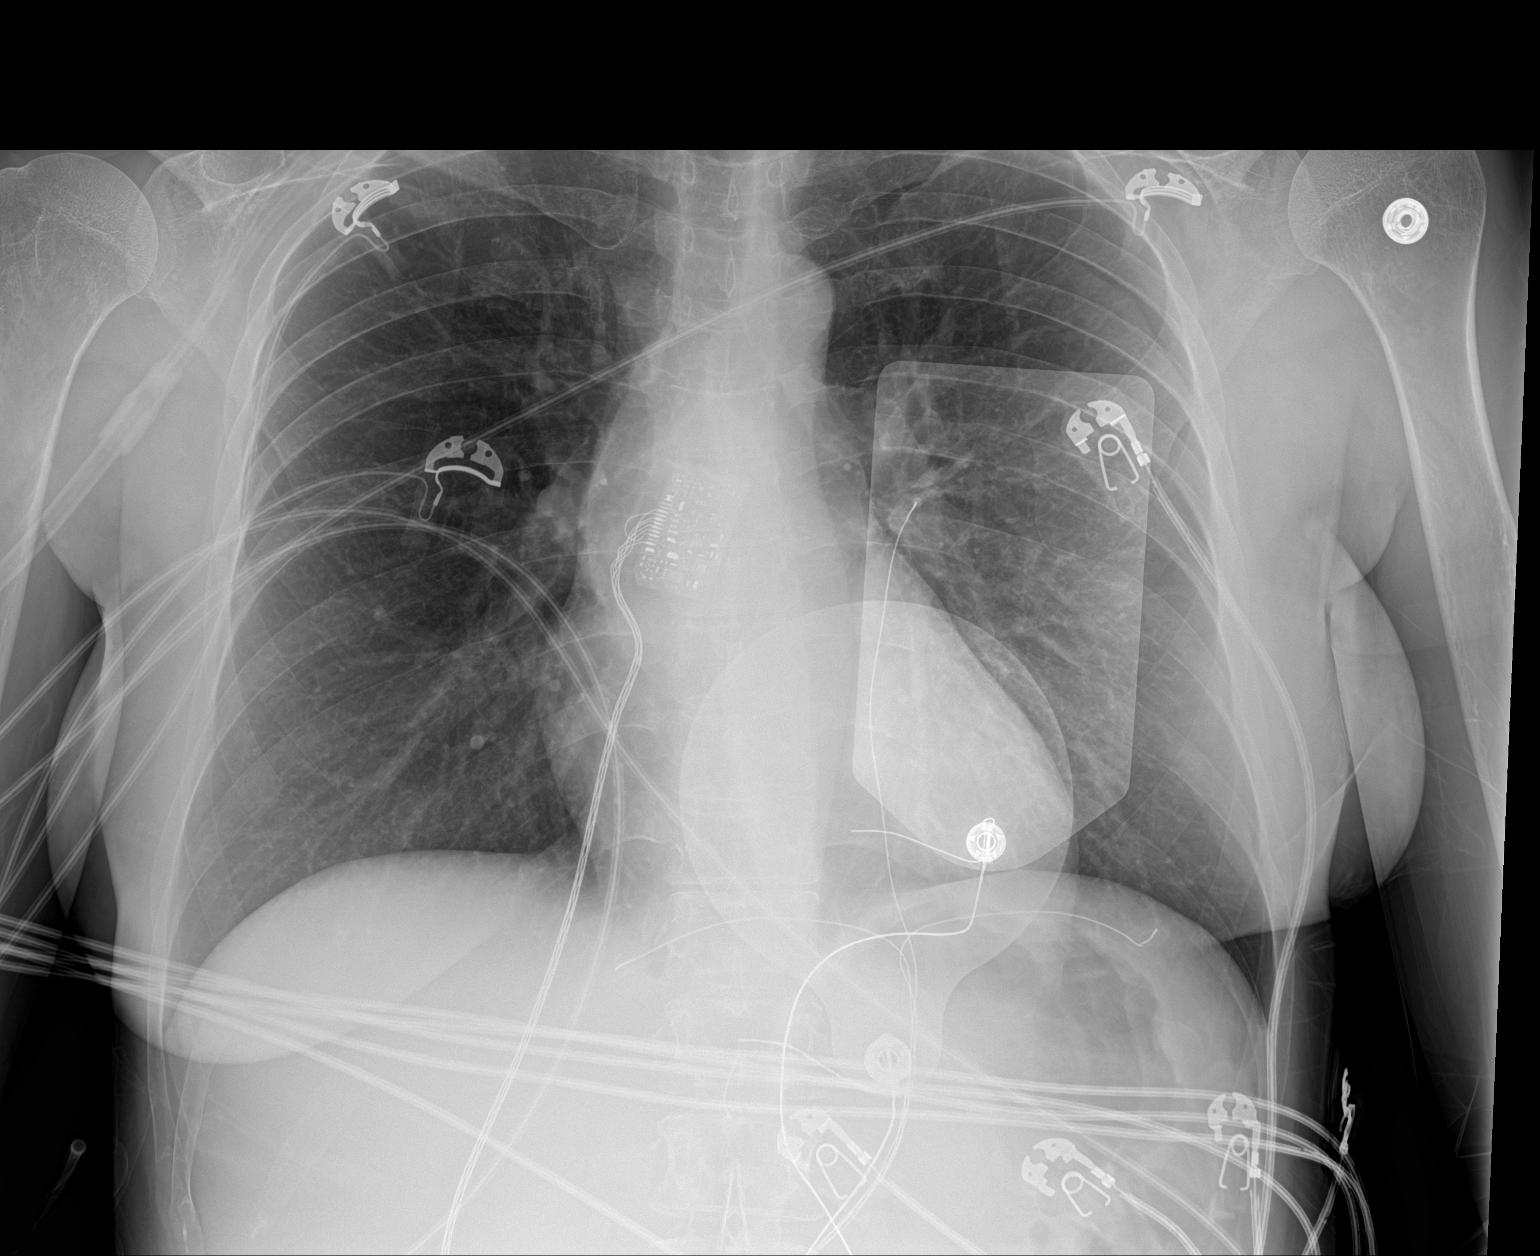

[1 of 1 positions shown; findings below may reference images not displayed]

FINDINGS: 8181 hours. The cardiopericardial silhouette is within normal limits
for size. No edema or focal airspace consolidation. 4-5 mm pulmonary
nodule identified over the right mid lung. The visualized bony
structures of the thorax are intact. Telemetry leads overlie the
chest.
IMPRESSION: 1. No acute cardiopulmonary findings.
2. 4-5 mm nodule projects over the right mid lung.. CT chest from
08/29/2012 shows no pulmonary nodule, but there is a similar size
breast calcification in the same region. Consider follow-up
dedicated PA and lateral two-view chest x-ray to further evaluate
after resolution of acute symptoms...
